# Patient Record
Sex: Female | Born: 1960
Health system: Southern US, Community
[De-identification: ages and names within clinical notes are randomized; demographics above are authoritative.]

## PROBLEM LIST (undated history)

## (undated) ENCOUNTER — Emergency Department: Disposition: A | Discharge status: Home / Self Care | Payer: BC Managed Care – PPO

## (undated) DIAGNOSIS — F319 Bipolar disorder, unspecified: Secondary | ICD-10-CM

## (undated) HISTORY — PX: ABDOMINAL HYSTERECTOMY: SHX81

---

## 1997-10-21 ENCOUNTER — Other Ambulatory Visit: Admission: RE | Admit: 1997-10-21 | Discharge: 1997-10-21 | Payer: Self-pay | Admitting: Family Medicine

## 2004-08-18 ENCOUNTER — Ambulatory Visit (HOSPITAL_COMMUNITY): Admission: RE | Admit: 2004-08-18 | Discharge: 2004-08-18 | Payer: Self-pay

## 2006-01-24 ENCOUNTER — Emergency Department (HOSPITAL_COMMUNITY): Admission: EM | Admit: 2006-01-24 | Discharge: 2006-01-25 | Payer: Self-pay | Admitting: Emergency Medicine

## 2006-01-25 ENCOUNTER — Ambulatory Visit: Payer: Self-pay | Admitting: Psychiatry

## 2006-01-25 ENCOUNTER — Inpatient Hospital Stay (HOSPITAL_COMMUNITY): Admission: AD | Admit: 2006-01-25 | Discharge: 2006-01-28 | Payer: Self-pay | Admitting: Psychiatry

## 2008-05-14 ENCOUNTER — Emergency Department (HOSPITAL_COMMUNITY): Admission: EM | Admit: 2008-05-14 | Discharge: 2008-05-14 | Payer: Self-pay | Admitting: Emergency Medicine

## 2010-03-30 ENCOUNTER — Encounter (HOSPITAL_COMMUNITY): Payer: Self-pay | Admitting: Obstetrics and Gynecology

## 2010-03-30 ENCOUNTER — Inpatient Hospital Stay (HOSPITAL_COMMUNITY): Admission: RE | Admit: 2010-03-30 | Discharge: 2010-04-01 | Payer: Self-pay | Admitting: Obstetrics and Gynecology

## 2010-09-10 LAB — CBC
MCHC: 34.6 g/dL (ref 30.0–36.0)
MCHC: 34.8 g/dL (ref 30.0–36.0)
Platelets: 294 10*3/uL (ref 150–400)
RDW: 13.6 % (ref 11.5–15.5)
RDW: 13.6 % (ref 11.5–15.5)
WBC: 10.6 10*3/uL — ABNORMAL HIGH (ref 4.0–10.5)
WBC: 8.3 10*3/uL (ref 4.0–10.5)

## 2010-09-10 LAB — TYPE AND SCREEN: ABO/RH(D): A POS

## 2010-09-10 LAB — SURGICAL PCR SCREEN
MRSA, PCR: NEGATIVE
Staphylococcus aureus: NEGATIVE

## 2010-09-10 LAB — PREGNANCY, URINE: Preg Test, Ur: NEGATIVE

## 2010-09-10 LAB — ABO/RH: ABO/RH(D): A POS

## 2010-11-13 NOTE — Discharge Summary (Signed)
Kelly Powers, Kelly Powers             ACCOUNT NO.:  [id number]   MEDICAL RECORD NO.:  [id number]          PATIENT TYPE:  IPS   LOCATION:  0501                          FACILITY:  [facility]   PHYSICIAN:  Anselm Jungling, MD  DATE OF BIRTH:  19-Jul-1960   DATE OF ADMISSION:  01/25/2006  DATE OF DISCHARGE:  01/28/2006                                 DISCHARGE SUMMARY   IDENTIFYING DATA AND REASON FOR ADMISSION:  This was the first [facility] admission  for Kelly Powers, a 50 year old married white female with a history of bipolar  disorder.  She was admitted via [facility], where she had been  treated for an overdose suicide attempt.  At the time of admission, she had  no current psychiatrist, but was about to start seeing Dr. Evelene Powers as an  outpatient.  She reported that with regard to the overdose, she had had a  bad day, and then called two friends in [place] state to say good-bye,  shortly after she had swallowed the overdose.  She had had inpatient  treatment four years ago somewhere in [place].  She denied suicidal ideation  at the time of admission and regretted her suicide attempt.  Please refer to  the admission note for further details pertaining to the symptoms,  circumstances, and history that led to her hospitalization.  She was given  an initial Axis I diagnosis of bipolar affective disorder, depressed,  without psychotic features.   MEDICAL AND LABORATORY:  The patient was medically treated and cleared at  [facility], and then assessed by the psychiatric nurse practitioner  upon admission to the inpatient service.  There were no significant medical  issues.   HOSPITAL COURSE:  The patient was admitted to the adult inpatient  psychiatric service.  She presented as a well-nourished, well-developed  female who was pleasant, bright, and well-organized.  There was nothing to  suggest any underlying psychosis or thought disorder.  She regretted her  overdose and suicide attempt,  and denied any further suicidal ideation.  She  was fully in favor of outpatient treatment, including individual counseling  and getting restarted on medications.   She was restarted on Lamictal, which she had taken previously.  Ambien was  utilized for sleep, and Xanax in low doses of 0.25 mg up to three times  daily as needed, for anxiety, were helpful as well.   The patient's husband came in for a family session.  In that meeting, the  patient reiterated that she was not suicidal.  She stated that she felt  ready to be discharged.  They discussed stresses at work, and the patient's  tendency to be a people pleaser and to take on a lot on the behalf of other  people.  Husband was very supportive.  They discussed the fact that patient  had just been fired from her job.   On the following day, the patient felt ready for discharge.  She continued  absent suicidal ideation.  She was tolerating medications well and was in  bright spirits.   AFTERCARE:  The patient was to follow  up with Dr. Evelene Powers with an appointment  on February 01, 2006.  In addition, the patient was to be set up with an  appointment with a therapist, Kelly Powers, to be arranged at the time of  this dictation.   DISCHARGE MEDICATIONS:  1.  Lamictal 100 mg b.i.d.  2.  Ambien 10 mg q.h.s. p.r.n. insomnia.  3.  Xanax 0.25 mg up to t.i.d. as needed for anxiety.   DISCHARGE DIAGNOSES:  AXIS I:  Bipolar disorder, currently depressed without  psychotic features.  AXIS II:  Deferred.  AXIS III:  No acute or chronic illnesses.  AXIS IV:  Stressors severe.  AXIS V:  Global Assessment of Functioning on discharge 70.           ______________________________  Anselm Jungling, MD  Electronically Signed     SPB/MEDQ  D:  01/31/2006  T:  01/31/2006  Job:  [phone]

## 2011-03-30 LAB — POCT RAPID STREP A: Streptococcus, Group A Screen (Direct): NEGATIVE

## 2011-07-26 ENCOUNTER — Other Ambulatory Visit: Payer: Self-pay | Admitting: Gastroenterology

## 2011-10-30 ENCOUNTER — Emergency Department (HOSPITAL_COMMUNITY)
Admission: EM | Admit: 2011-10-30 | Discharge: 2011-10-30 | Disposition: A | Discharge status: Home / Self Care | Payer: BC Managed Care – PPO | Attending: Emergency Medicine | Admitting: Emergency Medicine

## 2011-10-30 ENCOUNTER — Encounter (HOSPITAL_COMMUNITY): Payer: Self-pay | Admitting: *Deleted

## 2011-10-30 DIAGNOSIS — M549 Dorsalgia, unspecified: Secondary | ICD-10-CM | POA: Insufficient documentation

## 2011-10-30 DIAGNOSIS — M62838 Other muscle spasm: Secondary | ICD-10-CM | POA: Insufficient documentation

## 2011-10-30 DIAGNOSIS — J069 Acute upper respiratory infection, unspecified: Secondary | ICD-10-CM | POA: Insufficient documentation

## 2011-10-30 HISTORY — DX: Bipolar disorder, unspecified: F31.9

## 2011-10-30 MED ORDER — IBUPROFEN 800 MG PO TABS: 800.0000 mg | ORAL_TABLET | Freq: Three times a day (TID) | ORAL | Status: AC | PRN

## 2011-10-30 MED ORDER — OXYCODONE-ACETAMINOPHEN 5-325 MG PO TABS
1.0000 | ORAL_TABLET | Freq: Once | ORAL | Status: AC
  Administered 2011-10-30: 1 via ORAL
  Filled 2011-10-30: qty 1

## 2011-10-30 MED ORDER — METHOCARBAMOL 500 MG PO TABS: 1000.0000 mg | ORAL_TABLET | Freq: Three times a day (TID) | ORAL | Status: AC

## 2011-10-30 MED ORDER — HYDROCOD POLST-CHLORPHEN POLST 10-8 MG/5ML PO LQCR: 5.0000 mL | Freq: Two times a day (BID) | ORAL | Status: DC | PRN

## 2011-10-30 NOTE — ED Provider Notes (Signed)
Medical screening examination/treatment/procedure(s) were performed by non-physician practitioner and as supervising physician I was immediately available for consultation/collaboration.  Beryl Balz M Dajha Urquilla, MD 10/30/11 1826 

## 2011-10-30 NOTE — ED Provider Notes (Signed)
History     CSN: [id number]  Arrival date & time 10/30/11  0402   First MD Initiated Contact with Patient 10/30/11 0559      Chief Complaint  Patient presents with  . Back Pain    (Consider location/radiation/quality/duration/timing/severity/associated sxs/prior treatment) HPI Comments: Patient is a back pain and one week history of right nose, nasal congestion and cough. Patient states she she's coughing forcefully 2 days ago and had an acute onset of bilateral lower back pain. She's tried ibuprofen and heat for this without relief. States the pain is worse with movement in certain positions. It is interfering with her sleep. She denies red flag signs and symptoms of lower back pain. She denies shortness of breath or chest pain.  Patient is a 51 y.o. female presenting with back pain. The history is provided by the patient.  Back Pain  This is a new problem. The current episode started 2 days ago. The problem occurs constantly. The problem has not changed since onset.Associated with: coughing. The pain is present in the lumbar spine. The quality of the pain is described as aching. Radiates to: right and left buttock. The pain is moderate. The symptoms are aggravated by bending, certain positions and twisting. The pain is worse during the night. Pertinent negatives include no fever, no numbness, no weight loss, no bowel incontinence, no perianal numbness, no bladder incontinence, no dysuria, no pelvic pain, no paresthesias and no weakness. She has tried heat and NSAIDs for the symptoms. The treatment provided mild relief.    Past Medical History  Diagnosis Date  . Bipolar 1 disorder     Past Surgical History  Procedure Date  . Abdominal hysterectomy     History reviewed. No pertinent family history.  History  Substance Use Topics  . Smoking status: Former Smoker    Quit date: 10/30/1998  . Smokeless tobacco: Not on file  . Alcohol Use: Yes     occassionally/ x 1/week.    OB  History    Grav Para Term Preterm Abortions TAB SAB Ect Mult Living                  Review of Systems  Constitutional: Negative for fever, weight loss and unexpected weight change.  HENT: Positive for congestion and rhinorrhea. Negative for ear pain and sore throat.   Respiratory: Positive for cough.   Gastrointestinal: Negative for constipation and bowel incontinence.       Negative for fecal incontinence.   Genitourinary: Negative for bladder incontinence, dysuria, hematuria, flank pain, vaginal bleeding, vaginal discharge and pelvic pain.       Negative for urinary incontinence or retention.  Musculoskeletal: Positive for back pain.  Neurological: Negative for weakness, numbness and paresthesias.       Denies saddle paresthesias.    Allergies  Penicillins  Home Medications   Current Outpatient Rx  Name Route Sig Dispense Refill  . CARBAMAZEPINE ER (ANTIPSYCH) 200 MG PO CP12 Oral Take 600 mg by mouth at bedtime.     . ERGOCALCIFEROL 50000 UNITS PO CAPS Oral Take 50,000 Units by mouth once a week. saturday    . ESTRADIOL 1 MG PO TABS Oral Take 1 mg by mouth daily.    Marland Kitchen LAMOTRIGINE 200 MG PO TABS Oral Take 400 mg by mouth daily.      BP 116/70  Pulse 69  Temp(Src) 97.8 F (36.6 C) (Oral)  Resp 16  SpO2 95%  Physical Exam  Nursing note and vitals reviewed.  Constitutional: She appears well-developed and well-nourished.  HENT:  Head: Normocephalic and atraumatic.  Eyes: Conjunctivae are normal.  Neck: Normal range of motion. Neck supple.  Pulmonary/Chest: Effort normal and breath sounds normal. She has no wheezes.  Abdominal: Soft. There is no tenderness. There is no CVA tenderness.  Musculoskeletal: Normal range of motion.       There is no tenderness to palpation over cervical/thoracic/lumbar/sacral spine. Tenderness to palpation over lumbar paraspinal muscles. No step-off noted with palpation of spine.   Neurological: She is alert. She has normal strength and normal  reflexes. No sensory deficit. Gait normal.       5/5 strength in entire lower extremities bilaterally. No sensation deficit.   Skin: Skin is warm and dry. No rash noted.  Psychiatric: She has a normal mood and affect.    ED Course  Procedures (including critical care time)  Labs Reviewed - No data to display No results found.   1. Muscle spasm   2. Back pain   3. URI (upper respiratory infection)     6:15 AM Patient seen and examined. Medications ordered previously.   Vital signs reviewed and are as follows: Filed Vitals:   10/30/11 0408  BP: 116/70  Pulse: 69  Temp: 97.8 F (36.6 C)  Resp: 16   No red flag s/s of low back pain. Patient was counseled on back pain precautions and told to do activity as tolerated but do not lift, push, or pull heavy objects more than 10 pounds for the next week.  Patient counseled to use ice or heat on back for no longer than 15 minutes every hour.   Patient prescribed muscle relaxer and counseled on proper use of muscle relaxant medication.    Patient prescribed narcotic pain medicine/cough syrup and counseled on proper use of narcotic pain medications. Counseled not to combine this medication with others containing tylenol.   Counseled to take pain and relaxant medications only as prescribed and not to take more frequently, especially since she is on other antipsychotic meds.   Urged patient not to drink alcohol, drive, or perform any other activities that requires focus while taking either of these medications.  Patient urged to follow-up with PCP if pain does not improve with treatment and rest or if pain becomes recurrent. Urged to return with worsening severe pain, loss of bowel or bladder control, trouble walking.   The patient verbalizes understanding and agrees with the plan.  MDM  Patient with low back pain after coughing. No neurological deficits. Patient is ambulatory. No warning symptoms of back pain including: loss of bowel or  bladder control, night sweats, waking from sleep with back pain, unexplained fevers or weight loss, h/o cancer, IVDU, recent trauma. No concern for cauda equina, epidural abscess, or other serious cause of back pain. Conservative measures such as rest, ice/heat and pain medicine indicated with PCP follow-up if no improvement with conservative management. Cough syrup to treat pain and cough. Patient's current medication list reviewed.          [place], [place] 10/30/11 [phone]

## 2011-10-30 NOTE — ED Notes (Signed)
Pt c/o sudden onset low back pain starting Thursday morning after coughing. Pt denies relief from OTC ibuprofen. Pt c/o runny nose, cough, and shortness of breath x 1 week.

## 2011-10-30 NOTE — Discharge Instructions (Signed)
Please read and follow all provided instructions.  Your diagnoses today include:  1. Muscle spasm   2. Back pain     Tests performed today include:  Vital signs - see below for your results today  Medications prescribed:   Cough syrup containing hydrocodone - narcotic pain medication  You have been prescribed narcotic pain medication such as Vicodin or Percocet: DO NOT drive or perform any activities that require you to be awake and alert because this medicine can make you drowsy. BE VERY CAREFUL not to take multiple medicines containing Tylenol (also called acetaminophen). Doing so can lead to an overdose which can damage your liver and cause liver failure and possibly death.    Robaxin (methocarbamol) - muscle relaxer medication  You have been prescribed a muscle relaxer medication such as Robaxin, Flexeril, or Valium: DO NOT drive or perform any activities that require you to be awake and alert because this medicine can make you drowsy.    Ibuprofen - anti-inflammatory pain medication  Do not exceed 800mg  ibuprofen every 8 hours  You have been prescribed an anti-inflammatory medication or NSAID. Take with food. Take smallest effective dose for the shortest duration needed for your pain. Stop taking if you experience stomach pain or vomiting.   Take any prescribed medications only as directed.  Home care instructions:   Follow any educational materials contained in this packet  Please rest, use ice or heat on your back for the next several days  Do not lift, push, pull anything more than 10 pounds for the next week  Follow-up instructions: Please follow-up with your primary care provider in the next 1 week for further evaluation of your symptoms. If you do not have a primary care doctor -- see below for referral information.   Return instructions:  SEEK IMMEDIATE MEDICAL ATTENTION IF YOU HAVE:  New numbness, tingling, weakness, or problem with the use of your arms or  legs  Severe back pain not relieved with medications  Loss control of your bowels or bladder  Increasing pain in any areas of the body (such as chest or abdominal pain)  Shortness of breath, dizziness, or fainting.   Worsening nausea (feeling sick to your stomach), vomiting, fever, or sweats  Any other emergent concerns regarding your health   Additional Information:  Your vital signs today were: BP 116/70  Pulse 69  Temp(Src) 97.8 F (36.6 C) (Oral)  Resp 16  SpO2 95% If your blood pressure (BP) was elevated above 135/85 this visit, please have this repeated by your doctor within one month. -------------- No Primary Care Doctor Call [facility]  [phone] Other agencies that provide inexpensive medical care    [facility]  [phone]    [facility]  [phone]    [facility]  [phone]    [facility]  [phone]    [facility]  [phone]    [facility]  [phone] -------------- RESOURCE GUIDE:  Dental Problems  Patients with Medicaid: [facility] [phone] [address]                                            [phone] [address] Phone:  [phone]  Phone:  [phone]  If unable to pay or uninsured, contact:  [facility] or [facility]. to become qualified for the adult dental clinic.  Chronic Pain Problems Contact [facility]  [phone] Patients need to be referred by their primary care doctor.  Insufficient Money for Medicine Contact [employer]:  call "211" or [facility] [phone].  Psychological Services [facility]  [phone] [facility]  [phone] [facility]   [phone] (emergency services [phone])  Substance Abuse Resources [facility]  [phone] [facility] [phone] The  [place] 5147979771 Floydene Flock [phone] Residential & Outpatient Substance Abuse Program  [phone]  Abuse/Neglect [facility] Child Abuse Hotline [phone] [facility] Child Abuse Hotline [phone] (After Hours)  Emergency Shelter [facility] [phone]  Maternity Homes [facility] at the [place] [facility] [phone] [facility] [phone]  [facility] Resources  [facility]     [employer]                          [facility] Dept. [address] [place]                       [address]      [address]  Blondell Reveal Phone:  [phone]                                   Phone:  [phone]                 Phone:  [phone]  [facility] Mental Health Phone:  [phone]  [facility] Child Abuse Hotline [phone] [phone] (After Hours)

## 2011-10-30 NOTE — ED Notes (Signed)
MD/PA at bedside. 

## 2011-11-12 ENCOUNTER — Other Ambulatory Visit: Payer: Self-pay | Admitting: Orthopaedic Surgery

## 2011-11-12 DIAGNOSIS — M545 Low back pain, unspecified: Secondary | ICD-10-CM

## 2011-11-17 ENCOUNTER — Ambulatory Visit
Admission: RE | Admit: 2011-11-17 | Discharge: 2011-11-17 | Disposition: A | Discharge status: Home / Self Care | Payer: BC Managed Care – PPO | Source: Ambulatory Visit | Attending: Orthopaedic Surgery | Admitting: Orthopaedic Surgery

## 2011-11-17 DIAGNOSIS — M545 Low back pain, unspecified: Secondary | ICD-10-CM

## 2012-04-19 ENCOUNTER — Other Ambulatory Visit: Payer: Self-pay | Admitting: Orthopaedic Surgery

## 2012-04-19 DIAGNOSIS — M25512 Pain in left shoulder: Secondary | ICD-10-CM

## 2012-04-21 ENCOUNTER — Ambulatory Visit
Admission: RE | Admit: 2012-04-21 | Discharge: 2012-04-21 | Disposition: A | Discharge status: Home / Self Care | Payer: BC Managed Care – PPO | Source: Ambulatory Visit | Attending: Orthopaedic Surgery | Admitting: Orthopaedic Surgery

## 2012-04-21 DIAGNOSIS — M25512 Pain in left shoulder: Secondary | ICD-10-CM

## 2014-03-08 ENCOUNTER — Other Ambulatory Visit: Payer: Self-pay | Admitting: Obstetrics and Gynecology

## 2014-03-11 LAB — CYTOLOGY - PAP

## 2015-03-27 ENCOUNTER — Other Ambulatory Visit: Payer: Self-pay | Admitting: Obstetrics and Gynecology

## 2015-03-31 LAB — CYTOLOGY - PAP

## 2015-04-12 ENCOUNTER — Emergency Department (HOSPITAL_COMMUNITY): Payer: BLUE CROSS/BLUE SHIELD

## 2015-04-12 ENCOUNTER — Encounter (HOSPITAL_COMMUNITY): Payer: Self-pay | Admitting: Emergency Medicine

## 2015-04-12 ENCOUNTER — Emergency Department (HOSPITAL_COMMUNITY)
Admission: EM | Admit: 2015-04-12 | Discharge: 2015-04-13 | Disposition: A | Discharge status: Home / Self Care | Payer: BLUE CROSS/BLUE SHIELD | Attending: Emergency Medicine | Admitting: Emergency Medicine

## 2015-04-12 DIAGNOSIS — F319 Bipolar disorder, unspecified: Secondary | ICD-10-CM | POA: Diagnosis not present

## 2015-04-12 DIAGNOSIS — Z87891 Personal history of nicotine dependence: Secondary | ICD-10-CM | POA: Diagnosis not present

## 2015-04-12 DIAGNOSIS — Z880 Allergy status to penicillin: Secondary | ICD-10-CM | POA: Diagnosis not present

## 2015-04-12 DIAGNOSIS — R202 Paresthesia of skin: Secondary | ICD-10-CM | POA: Diagnosis not present

## 2015-04-12 DIAGNOSIS — R1310 Dysphagia, unspecified: Secondary | ICD-10-CM

## 2015-04-12 DIAGNOSIS — Z79899 Other long term (current) drug therapy: Secondary | ICD-10-CM | POA: Insufficient documentation

## 2015-04-12 NOTE — ED Notes (Signed)
Patient here with complaint of "forced swallowing". States that she began to feel this way a couple hours ago. States that she can drink fluids without regurgitation. Explains that she is able to swallow but it feels difficult. Denies nausea, vomiting, diarrhea, cough, congestion, and only states "my co-worker said my nose was red yesterday"; nose currently appears normal. NAD, ambulatory. Deep breath does not induce pain, lung sounds clear.

## 2015-04-12 NOTE — ED Provider Notes (Signed)
CSN: [id number]     Arrival date & time 04/12/15  2259 History  By signing my name below, I, Lyndel Safe, attest that this documentation has been prepared under the direction and in the presence of Arthor Captain, PA-C. Electronically Signed: Lyndel Safe, ED Scribe. 04/12/2015. 12:00 AM.   Chief Complaint  Patient presents with  . Dysphagia   The history is provided by the patient. No language interpreter was used.   HPI Comments: Kelly Powers is a 54 y.o. female who presents to the Emergency Department complaining of constant, dysphagia that she describes as forced swallowing onset spontaneously 3 hours ago. Pt is able to drink and swallow fluids but she states she has to force herself to swallow. She associates a paraesthesia sensation to the tip of her tongue and mild irritation in the back of her throat but denies this to be a sore throat. Also denies past similar experiences, dysphonia or speech difficulty, or unilateral weakness.   Past Medical History  Diagnosis Date  . Bipolar 1 disorder [facility])    Past Surgical History  Procedure Laterality Date  . Abdominal hysterectomy     History reviewed. No pertinent family history. Social History  Substance Use Topics  . Smoking status: Former Smoker    Quit date: 10/30/1998  . Smokeless tobacco: None  . Alcohol Use: Yes     Comment: occassionally/ x 1/week.   OB History    No data available     Review of Systems  HENT: Positive for trouble swallowing. Negative for sore throat.   Neurological: Positive for numbness. Negative for speech difficulty and weakness.   Allergies  Penicillins  Home Medications   Prior to Admission medications   Medication Sig Start Date End Date Taking? Authorizing Provider  carbamazepine (ANTIPSYCHOTIC - EQUETRO) 200 MG CP12 Take 600 mg by mouth at bedtime.     Historical Provider, MD  chlorpheniramine-HYDROcodone (TUSSIONEX PENNKINETIC ER) 10-8 MG/5ML LQCR Take 5 mLs by mouth every 12  (twelve) hours as needed. 10/30/11   Renne Crigler, PA-C  ergocalciferol (VITAMIN D2) 50000 UNITS capsule Take 50,000 Units by mouth once a week. saturday    Historical Provider, MD  estradiol (ESTRACE) 1 MG tablet Take 1 mg by mouth daily.    Historical Provider, MD  lamoTRIgine (LAMICTAL) 200 MG tablet Take 400 mg by mouth daily.    Historical Provider, MD   BP 144/75 mmHg  Pulse 78  Temp(Src) 98.4 F (36.9 C) (Oral)  Resp 18  Ht  (1.651 m)  Wt 179 lb (81.194 kg)  BMI 29.79 kg/m2  SpO2 99% Physical Exam  Constitutional: She is oriented to person, place, and time. She appears well-developed and well-nourished. No distress.  HENT:  Head: Normocephalic and atraumatic.  Mouth/Throat: Oropharynx is clear and moist.  Eyes: Conjunctivae and EOM are normal. Pupils are equal, round, and reactive to light. No scleral icterus.  No horizontal, vertical or rotational nystagmus  Neck: Normal range of motion. Neck supple.  Full active and passive ROM without pain No midline or paraspinal tenderness No nuchal rigidity or meningeal signs  Cardiovascular: Normal rate, regular rhythm and intact distal pulses.   Pulmonary/Chest: Effort normal and breath sounds normal. No respiratory distress. She has no wheezes. She has no rales.  Abdominal: Soft. Bowel sounds are normal. There is no tenderness. There is no rebound and no guarding.  Musculoskeletal: Normal range of motion.  Lymphadenopathy:    She has no cervical adenopathy.  Neurological: She is  alert and oriented to person, place, and time. She has normal reflexes. No cranial nerve deficit. She exhibits normal muscle tone. Coordination normal.  Mental Status:  Alert, oriented, thought content appropriate. Speech fluent without evidence of aphasia. Able to follow 2 step commands without difficulty.  Cranial Nerves:  II:  Peripheral visual fields grossly normal, pupils equal, round, reactive to light III,IV, VI: ptosis not present, extra-ocular  motions intact bilaterally  V,VII: smile symmetric, facial light touch sensation equal VIII: hearing grossly normal bilaterally  IX,X: midline uvula rise  XI: bilateral shoulder shrug equal and strong XII: midline tongue extension  Motor:  5/5 in upper and lower extremities bilaterally including strong and equal grip strength and dorsiflexion/plantar flexion Sensory: Pinprick and light touch normal in all extremities.  Deep Tendon Reflexes: 2+ and symmetric  Cerebellar: normal finger-to-nose with bilateral upper extremities Gait: normal gait and balance CV: distal pulses palpable throughout   Skin: Skin is warm and dry. No rash noted. She is not diaphoretic.  Psychiatric: She has a normal mood and affect. Her behavior is normal. Judgment and thought content normal.  Nursing note and vitals reviewed.   ED Course  Procedures  DIAGNOSTIC STUDIES: Oxygen Saturation is 99% on RA, normal by my interpretation.    COORDINATION OF CARE: 11:57 PM Discussed treatment plan with pt at bedside and pt agreed to plan.  Imaging Review No results found. I have personally reviewed and evaluated these images results as part of my medical decision-making.   MDM   Final diagnoses:  None    12:42 AM. Code stroke ordered CT head negative. Seen in shared visit wth Dr. Clayborne DanaMesner Distant smoking hx. No other risk factors    I, Arthor CaptainHarris, Syniah Berne, personally performed the services described in this documentation. All medical record entries made by the scribe were at my direction and in my presence.  I have reviewed the chart and discharge instructions and agree that the record reflects my personal performance and is accurate and complete. Arthor CaptainHarris, Romi Rathel.  04/13/2015. 12:45 AM.       Arthor CaptainAbigail Liam Bossman, PA-C 04/13/15 0045  Marily MemosJason Mesner, MD 04/13/15 [phone]

## 2015-04-13 ENCOUNTER — Emergency Department (HOSPITAL_COMMUNITY): Payer: BLUE CROSS/BLUE SHIELD

## 2015-04-13 DIAGNOSIS — R1310 Dysphagia, unspecified: Secondary | ICD-10-CM | POA: Diagnosis not present

## 2015-04-13 DIAGNOSIS — R202 Paresthesia of skin: Secondary | ICD-10-CM

## 2015-04-13 LAB — CBC
HEMATOCRIT: 35.7 % — AB (ref 36.0–46.0)
Hemoglobin: 12.4 g/dL (ref 12.0–15.0)
MCH: 30.8 pg (ref 26.0–34.0)
MCHC: 34.7 g/dL (ref 30.0–36.0)
MCV: 88.6 fL (ref 78.0–100.0)
PLATELETS: 210 10*3/uL (ref 150–400)
RBC: 4.03 MIL/uL (ref 3.87–5.11)
RDW: 12.6 % (ref 11.5–15.5)
WBC: 7.1 10*3/uL (ref 4.0–10.5)

## 2015-04-13 LAB — RAPID URINE DRUG SCREEN, HOSP PERFORMED
AMPHETAMINES: NOT DETECTED
BARBITURATES: NOT DETECTED
BENZODIAZEPINES: NOT DETECTED
Cocaine: NOT DETECTED
Opiates: NOT DETECTED
Tetrahydrocannabinol: NOT DETECTED

## 2015-04-13 LAB — COMPREHENSIVE METABOLIC PANEL
ALBUMIN: 3.9 g/dL (ref 3.5–5.0)
ALT: 24 U/L (ref 14–54)
AST: 21 U/L (ref 15–41)
Alkaline Phosphatase: 41 U/L (ref 38–126)
Anion gap: 6 (ref 5–15)
BUN: 15 mg/dL (ref 6–20)
CHLORIDE: 102 mmol/L (ref 101–111)
CO2: 33 mmol/L — AB (ref 22–32)
CREATININE: 0.79 mg/dL (ref 0.44–1.00)
Calcium: 9 mg/dL (ref 8.9–10.3)
GFR calc Af Amer: >60 mL/min (ref >60)
GFR calc non Af Amer: >60 mL/min (ref >60)
Glucose, Bld: 94 mg/dL (ref 65–99)
Potassium: 4.2 mmol/L (ref 3.5–5.1)
Sodium: 141 mmol/L (ref 135–145)
Total Bilirubin: 0.4 mg/dL (ref 0.3–1.2)
Total Protein: 6.5 g/dL (ref 6.5–8.1)

## 2015-04-13 LAB — I-STAT CHEM 8, ED
BUN: 18 mg/dL (ref 6–20)
CHLORIDE: 100 mmol/L — AB (ref 101–111)
Calcium, Ion: 1.1 mmol/L — ABNORMAL LOW (ref 1.12–1.23)
Creatinine, Ser: 0.9 mg/dL (ref 0.44–1.00)
Glucose, Bld: 88 mg/dL (ref 65–99)
HCT: 38 % (ref 36.0–46.0)
Hemoglobin: 12.9 g/dL (ref 12.0–15.0)
POTASSIUM: 4.2 mmol/L (ref 3.5–5.1)
Sodium: 141 mmol/L (ref 135–145)
TCO2: 27 mmol/L (ref 0–100)

## 2015-04-13 LAB — DIFFERENTIAL
BASOS ABS: 0 10*3/uL (ref 0.0–0.1)
BASOS PCT: 0 %
Eosinophils Absolute: 0.2 10*3/uL (ref 0.0–0.7)
Eosinophils Relative: 3 %
Lymphocytes Relative: 38 %
Lymphs Abs: 2.7 10*3/uL (ref 0.7–4.0)
Monocytes Absolute: 0.5 10*3/uL (ref 0.1–1.0)
Monocytes Relative: 7 %
NEUTROS ABS: 3.7 10*3/uL (ref 1.7–7.7)
Neutrophils Relative %: 52 %

## 2015-04-13 LAB — PROTIME-INR
INR: 1.06 (ref 0.00–1.49)
Prothrombin Time: 14 seconds (ref 11.6–15.2)

## 2015-04-13 LAB — URINALYSIS, ROUTINE W REFLEX MICROSCOPIC
BILIRUBIN URINE: NEGATIVE
GLUCOSE, UA: NEGATIVE mg/dL
Hgb urine dipstick: NEGATIVE
KETONES UR: NEGATIVE mg/dL
Leukocytes, UA: NEGATIVE
NITRITE: NEGATIVE
PH: 7 (ref 5.0–8.0)
Protein, ur: NEGATIVE mg/dL
Specific Gravity, Urine: 1.009 (ref 1.005–1.030)
Urobilinogen, UA: 0.2 mg/dL (ref 0.0–1.0)

## 2015-04-13 LAB — I-STAT TROPONIN, ED: Troponin i, poc: 0 ng/mL (ref 0.00–0.08)

## 2015-04-13 LAB — APTT: APTT: 31 s (ref 24–37)

## 2015-04-13 LAB — ETHANOL

## 2015-04-13 LAB — CARBAMAZEPINE LEVEL, TOTAL: Carbamazepine Lvl: 5.4 ug/mL (ref 4.0–12.0)

## 2015-04-13 MED ORDER — LORAZEPAM 2 MG/ML IJ SOLN
1.0000 mg | Freq: Once | INTRAMUSCULAR | Status: AC
  Administered 2015-04-13: 1 mg via INTRAVENOUS
  Filled 2015-04-13: qty 1

## 2015-04-13 MED ORDER — LORAZEPAM 2 MG/ML IJ SOLN: 1.0000 mg | Freq: Once | INTRAMUSCULAR | Status: DC

## 2015-04-13 NOTE — Consult Note (Signed)
Referring Physician: Mesner    Chief Complaint: Difficulty swallowing, numbness on the tip of her tongue  HPI: Kelly Powers is an 54 y.o. female who reports that she was home with her husband and had the acute onset of difficulty swallowing.  Then noted numbness on the tip of her tongue.  She has never experienced this before.  Symptoms have improved but not completely resolved.  She is able to handle her own secretions but reports that she must work to swallow.  Initial NIHSS of 0.  Date last known well: Date: 04/12/2015 Time last known well: Time: 21:30 tPA Given: No: Minimal symptoms  MRankin: 0  Past Medical History  Diagnosis Date  . Bipolar 1 disorder [facility])     Past Surgical History  Procedure Laterality Date  . Abdominal hysterectomy      Family history: Father alive with diabetes.  Mother deceased from COPD.  Also had steroid induced diabetes.    Social History:  reports that she quit smoking about 16 years ago. She does not have any smokeless tobacco history on file. She reports that she drinks alcohol. She reports that she does not use illicit drugs.  Allergies:  Allergies  Allergen Reactions  . Penicillins Rash    Medications: I have reviewed the patient's current medications. Prior to Admission:  Prior to Admission medications   Medication Sig Start Date End Date Taking? Authorizing Provider  carbamazepine (ANTIPSYCHOTIC - EQUETRO) 200 MG CP12 Take 600 mg by mouth at bedtime.     Historical Provider, MD  chlorpheniramine-HYDROcodone (TUSSIONEX PENNKINETIC ER) 10-8 MG/5ML LQCR Take 5 mLs by mouth every 12 (twelve) hours as needed. 10/30/11   Renne Crigler, PA-C  ergocalciferol (VITAMIN D2) 50000 UNITS capsule Take 50,000 Units by mouth once a week. saturday    Historical Provider, MD  estradiol (ESTRACE) 1 MG tablet Take 1 mg by mouth daily.    Historical Provider, MD  lamoTRIgine (LAMICTAL) 200 MG tablet Take 400 mg by mouth daily.    Historical Provider, MD    ROS: History obtained from the patient  General ROS: negative for - chills, fatigue, fever, night sweats, weight gain or weight loss Psychological ROS: negative for - behavioral disorder, hallucinations, memory difficulties, mood swings or suicidal ideation Ophthalmic ROS: negative for - blurry vision, double vision, eye pain or loss of vision ENT ROS: negative for - epistaxis, nasal discharge, oral lesions, sore throat, tinnitus or vertigo Allergy and Immunology ROS: negative for - hives or itchy/watery eyes Hematological and Lymphatic ROS: negative for - bleeding problems, bruising or swollen lymph nodes Endocrine ROS: negative for - galactorrhea, hair pattern changes, polydipsia/polyuria or temperature intolerance Respiratory ROS: negative for - cough, hemoptysis, shortness of breath or wheezing Cardiovascular ROS: negative for - chest pain, dyspnea on exertion, edema or irregular heartbeat Gastrointestinal ROS: negative for - abdominal pain, diarrhea, hematemesis, nausea/vomiting or stool incontinence Genito-Urinary ROS: negative for - dysuria, hematuria, incontinence or urinary frequency/urgency Musculoskeletal ROS: negative for - joint swelling or muscular weakness Neurological ROS: as noted in HPI Dermatological ROS: negative for rash and skin lesion changes  Physical Examination: Blood pressure 144/75, pulse 78, temperature 98.4 F (36.9 C), temperature source Oral, resp. rate 18, height  (1.651 m), weight 81.194 kg (179 lb), SpO2 99 %.  Gen-NAD HEENT-  Normocephalic, no lesions, without obvious abnormality.  Normal external eye and conjunctiva.  Normal TM's bilaterally.  Normal auditory canals and external ears. Normal external nose, mucus membranes and septum.  Normal pharynx.  Cardiovascular- S1, S2 normal, pulses palpable throughout   Lungs- chest clear, no wheezing, rales, normal symmetric air entry Abdomen- soft, non-tender; bowel sounds normal; no masses,  no  organomegaly Extremities- no edema Lymph-no adenopathy palpable Musculoskeletal-no joint tenderness, deformity or swelling Skin-warm and dry, no hyperpigmentation, vitiligo, or suspicious lesions  Neurological Examination Mental Status: Alert, oriented, thought content appropriate.  Speech fluent without evidence of aphasia.  Able to follow 3 step commands without difficulty. Cranial Nerves: II: Discs flat bilaterally; Visual fields grossly normal, pupils equal, round, reactive to light and accommodation III,IV, VI: ptosis not present, extra-ocular motions intact bilaterally V,VII: smile symmetric, facial light touch sensation normal bilaterally VIII: hearing normal bilaterally IX,X: gag reflex present XI: bilateral shoulder shrug XII: midline tongue extension Motor: Right : Upper extremity   5/5    Left:     Upper extremity   5/5  Lower extremity   5/5     Lower extremity   5/5 Tone and bulk:normal tone throughout; no atrophy noted Sensory: Pinprick and light touch intact throughout, bilaterally Deep Tendon Reflexes: 2+ and symmetric throughout Plantars: Right: downgoing   Left: downgoing Cerebellar: normal finger-to-nose and normal heel-to-shin testing bilaterally   Laboratory Studies:  Basic Metabolic Panel:  Recent Labs Lab 04/13/15 0023  NA 141  K 4.2  CL 100*  GLUCOSE 88  BUN 18  CREATININE 0.90    Liver Function Tests: No results for input(s): AST, ALT, ALKPHOS, BILITOT, PROT, ALBUMIN in the last 168 hours. No results for input(s): LIPASE, AMYLASE in the last 168 hours. No results for input(s): AMMONIA in the last 168 hours.  CBC:  Recent Labs Lab 04/13/15 0013 04/13/15 0023  WBC 7.1  --   NEUTROABS 3.7  --   HGB 12.4 12.9  HCT 35.7* 38.0  MCV 88.6  --   PLT 210  --     Cardiac Enzymes: No results for input(s): CKTOTAL, CKMB, CKMBINDEX, TROPONINI in the last 168 hours.  BNP: Invalid input(s): POCBNP  CBG: No results for input(s): GLUCAP in  the last 168 hours.  Microbiology: Results for orders placed or performed during the hospital encounter of 03/30/10  Surgical pcr screen     Status: None   Collection Time: 03/23/10 12:00 PM  Result Value Ref Range Status   MRSA, PCR NEGATIVE NEGATIVE Final   Staphylococcus aureus  NEGATIVE Final    NEGATIVE        The Xpert SA Assay (FDA approved for NASAL specimens only), is one component of a comprehensive surveillance program.  It is not intended to diagnose infection nor to guide or monitor treatment.    Coagulation Studies:  Recent Labs  04/13/15 0013  LABPROT 14.0  INR 1.06    Urinalysis: No results for input(s): COLORURINE, LABSPEC, PHURINE, GLUCOSEU, HGBUR, BILIRUBINUR, KETONESUR, PROTEINUR, UROBILINOGEN, NITRITE, LEUKOCYTESUR in the last 168 hours.  Invalid input(s): APPERANCEUR  Lipid Panel: No results found for: CHOL, TRIG, HDL, CHOLHDL, VLDL, LDLCALC  HgbA1C: No results found for: HGBA1C  Urine Drug Screen:  No results found for: LABOPIA, COCAINSCRNUR, LABBENZ, AMPHETMU, THCU, LABBARB  Alcohol Level:  Recent Labs Lab 04/13/15 0013  ETH <5    Other results: EKG: sinus rhythm at 60 bpm.  Imaging: Dg Neck Soft Tissue  04/13/2015  CLINICAL DATA:  Difficulty swallowing EXAM: NECK SOFT TISSUES - 1+ VIEW COMPARISON:  None. FINDINGS: There is no evidence of retropharyngeal soft tissue swelling or epiglottic enlargement. The cervical airway is unremarkable and no radio-opaque foreign body  identified. There is degenerative disc disease at C4-5, C5-6 and C6-7. IMPRESSION: Unremarkable neck soft tissues. Electronically Signed   By: Elige KoHetal  Patel   On: 04/13/2015 00:13   Ct Head Wo Contrast  04/13/2015  CLINICAL DATA:  Numbness, tingling, dysphagia EXAM: CT HEAD WITHOUT CONTRAST TECHNIQUE: Contiguous axial images were obtained from the base of the skull through the vertex without intravenous contrast. COMPARISON:  None. FINDINGS: There is no evidence of mass  effect, midline shift or extra-axial fluid collections. There is no evidence of a space-occupying lesion or intracranial hemorrhage. There is no evidence of a cortical-based area of acute infarction. The ventricles and sulci are appropriate for the patient's age. The basal cisterns are patent. Visualized portions of the orbits are unremarkable. The visualized portions of the paranasal sinuses and mastoid air cells are unremarkable. The osseous structures are unremarkable. IMPRESSION: No acute intracranial pathology. These results were called by telephone at the time of interpretation on 04/13/2015 at 12:35 am to [facility] , who verbally acknowledged these results. Electronically Signed   By: Elige KoHetal  Patel   On: 04/13/2015 00:35    Assessment: 54 y.o. female presenting with new onset numbness on the tip of the tongue and difficulty swallowing.  Otherwise neurological examination is unremarkable.  NIHSS of 0.  Head CT personally reviewed and shows no acute changes.  Will rule out the possibility of dissection as well.    Stroke Risk Factors - none  Plan: 1. MRI, MRA  of the brain without contrast 2. Tegretol level 3. Will follow up after above results are available.     Thana FarrLeslie Ericca Labra, MD [facility] [phone] 04/13/2015, 12:53 AM  Addendum: MRI and MRA personally reviewed.  No abnormalities noted.  Tegretol level normal.  Recommendations: 1.  Patient to follow up with ENT 2.  No further neurologic intervention is recommended at this time.  Thank you for allowing neurology to participate in the care of this patient.  Case discussed with Dr. Myrla HalstedMesner  Annjanette Wertenberger, MD [facility] [phone] 04/13/2015  3:57 AM

## 2015-04-13 NOTE — ED Notes (Signed)
Pt to MRI at this time.

## 2015-04-13 NOTE — ED Notes (Signed)
Code stroke called

## 2015-04-13 NOTE — Progress Notes (Signed)
Code Stroke called on 54 y.o female. Patient complains of acute difficulty swallowing associated with numbness and tingling of tongue and mouth. Denies any similar symptoms prior to this incident. Past medical history includes bipolar disorder per Epic chart review.  CT completed STAT, negative per Neurologist Dr. Thad Rangereynolds. NIHSS completed yielding 0. CBG 88 on STAT blood draw. Pt for MRI tonight, further stroke work up pending results. Pt to remain within TPA window until 0200.

## 2015-04-13 NOTE — ED Notes (Signed)
Pt has already been to xray.  She developed some swallowing difficulty after eating an apple approx 2 hours ago.. She can swallow better if she drinks water  Then swallows

## 2015-04-13 NOTE — ED Notes (Signed)
thge pt requires more  Treatment than fast track.  Pa sending to regular ed bed

## 2015-04-13 NOTE — ED Notes (Signed)
Patient denies pain and is resting comfortably.  

## 2015-04-13 NOTE — ED Notes (Signed)
Dr. Thad Rangereynolds in to assess pt

## 2016-01-09 DIAGNOSIS — N3000 Acute cystitis without hematuria: Secondary | ICD-10-CM | POA: Diagnosis not present

## 2016-01-09 DIAGNOSIS — R300 Dysuria: Secondary | ICD-10-CM | POA: Diagnosis not present

## 2016-01-09 DIAGNOSIS — S1086XA Insect bite of other specified part of neck, initial encounter: Secondary | ICD-10-CM | POA: Diagnosis not present

## 2016-01-13 DIAGNOSIS — R300 Dysuria: Secondary | ICD-10-CM | POA: Diagnosis not present

## 2016-01-15 DIAGNOSIS — F3111 Bipolar disorder, current episode manic without psychotic features, mild: Secondary | ICD-10-CM | POA: Diagnosis not present

## 2016-01-15 DIAGNOSIS — F3176 Bipolar disorder, in full remission, most recent episode depressed: Secondary | ICD-10-CM | POA: Diagnosis not present

## 2016-02-25 DIAGNOSIS — K2900 Acute gastritis without bleeding: Secondary | ICD-10-CM | POA: Diagnosis not present

## 2016-04-07 DIAGNOSIS — Z01419 Encounter for gynecological examination (general) (routine) without abnormal findings: Secondary | ICD-10-CM | POA: Diagnosis not present

## 2016-04-07 DIAGNOSIS — Z6832 Body mass index (BMI) 32.0-32.9, adult: Secondary | ICD-10-CM | POA: Diagnosis not present

## 2016-04-07 DIAGNOSIS — Z1231 Encounter for screening mammogram for malignant neoplasm of breast: Secondary | ICD-10-CM | POA: Diagnosis not present

## 2016-04-07 DIAGNOSIS — Z13820 Encounter for screening for osteoporosis: Secondary | ICD-10-CM | POA: Diagnosis not present

## 2016-06-06 DIAGNOSIS — S46812A Strain of other muscles, fascia and tendons at shoulder and upper arm level, left arm, initial encounter: Secondary | ICD-10-CM | POA: Diagnosis not present

## 2016-07-20 DIAGNOSIS — F3111 Bipolar disorder, current episode manic without psychotic features, mild: Secondary | ICD-10-CM | POA: Diagnosis not present

## 2016-07-28 DIAGNOSIS — Z23 Encounter for immunization: Secondary | ICD-10-CM | POA: Diagnosis not present

## 2016-07-28 DIAGNOSIS — Z0000 Encounter for general adult medical examination without abnormal findings: Secondary | ICD-10-CM | POA: Diagnosis not present

## 2016-07-28 DIAGNOSIS — E785 Hyperlipidemia, unspecified: Secondary | ICD-10-CM | POA: Diagnosis not present

## 2016-09-01 DIAGNOSIS — F3131 Bipolar disorder, current episode depressed, mild: Secondary | ICD-10-CM | POA: Diagnosis not present

## 2016-09-01 DIAGNOSIS — F4322 Adjustment disorder with anxiety: Secondary | ICD-10-CM | POA: Diagnosis not present

## 2016-09-01 DIAGNOSIS — F3111 Bipolar disorder, current episode manic without psychotic features, mild: Secondary | ICD-10-CM | POA: Diagnosis not present

## 2016-10-13 DIAGNOSIS — F3174 Bipolar disorder, in full remission, most recent episode manic: Secondary | ICD-10-CM | POA: Diagnosis not present

## 2016-10-13 DIAGNOSIS — F3176 Bipolar disorder, in full remission, most recent episode depressed: Secondary | ICD-10-CM | POA: Diagnosis not present

## 2016-12-15 DIAGNOSIS — F3176 Bipolar disorder, in full remission, most recent episode depressed: Secondary | ICD-10-CM | POA: Diagnosis not present

## 2016-12-15 DIAGNOSIS — F3174 Bipolar disorder, in full remission, most recent episode manic: Secondary | ICD-10-CM | POA: Diagnosis not present

## 2016-12-15 DIAGNOSIS — F900 Attention-deficit hyperactivity disorder, predominantly inattentive type: Secondary | ICD-10-CM | POA: Diagnosis not present

## 2017-01-21 DIAGNOSIS — E559 Vitamin D deficiency, unspecified: Secondary | ICD-10-CM | POA: Diagnosis not present

## 2017-03-01 DIAGNOSIS — N390 Urinary tract infection, site not specified: Secondary | ICD-10-CM | POA: Diagnosis not present

## 2017-03-01 DIAGNOSIS — N3000 Acute cystitis without hematuria: Secondary | ICD-10-CM | POA: Diagnosis not present

## 2017-04-05 DIAGNOSIS — L237 Allergic contact dermatitis due to plants, except food: Secondary | ICD-10-CM | POA: Diagnosis not present

## 2017-04-13 DIAGNOSIS — Z6831 Body mass index (BMI) 31.0-31.9, adult: Secondary | ICD-10-CM | POA: Diagnosis not present

## 2017-04-13 DIAGNOSIS — N9089 Other specified noninflammatory disorders of vulva and perineum: Secondary | ICD-10-CM | POA: Diagnosis not present

## 2017-04-13 DIAGNOSIS — Z1231 Encounter for screening mammogram for malignant neoplasm of breast: Secondary | ICD-10-CM | POA: Diagnosis not present

## 2017-04-13 DIAGNOSIS — Z01419 Encounter for gynecological examination (general) (routine) without abnormal findings: Secondary | ICD-10-CM | POA: Diagnosis not present

## 2017-05-16 DIAGNOSIS — J0110 Acute frontal sinusitis, unspecified: Secondary | ICD-10-CM | POA: Diagnosis not present

## 2017-05-16 DIAGNOSIS — Z23 Encounter for immunization: Secondary | ICD-10-CM | POA: Diagnosis not present

## 2017-06-10 DIAGNOSIS — F3174 Bipolar disorder, in full remission, most recent episode manic: Secondary | ICD-10-CM | POA: Diagnosis not present

## 2017-06-10 DIAGNOSIS — F3176 Bipolar disorder, in full remission, most recent episode depressed: Secondary | ICD-10-CM | POA: Diagnosis not present

## 2017-07-03 DIAGNOSIS — B9689 Other specified bacterial agents as the cause of diseases classified elsewhere: Secondary | ICD-10-CM | POA: Diagnosis not present

## 2017-07-03 DIAGNOSIS — J019 Acute sinusitis, unspecified: Secondary | ICD-10-CM | POA: Diagnosis not present

## 2017-07-29 DIAGNOSIS — J342 Deviated nasal septum: Secondary | ICD-10-CM | POA: Diagnosis not present

## 2017-07-29 DIAGNOSIS — R51 Headache: Secondary | ICD-10-CM | POA: Diagnosis not present

## 2017-08-22 DIAGNOSIS — J342 Deviated nasal septum: Secondary | ICD-10-CM | POA: Diagnosis not present

## 2017-08-22 DIAGNOSIS — F40240 Claustrophobia: Secondary | ICD-10-CM | POA: Diagnosis not present

## 2017-08-22 DIAGNOSIS — R51 Headache: Secondary | ICD-10-CM | POA: Diagnosis not present

## 2017-08-24 ENCOUNTER — Other Ambulatory Visit: Payer: Self-pay | Admitting: Otolaryngology

## 2017-08-24 DIAGNOSIS — J342 Deviated nasal septum: Secondary | ICD-10-CM

## 2017-09-17 ENCOUNTER — Other Ambulatory Visit: Payer: Self-pay

## 2017-12-01 DIAGNOSIS — E785 Hyperlipidemia, unspecified: Secondary | ICD-10-CM | POA: Diagnosis not present

## 2017-12-01 DIAGNOSIS — Z0000 Encounter for general adult medical examination without abnormal findings: Secondary | ICD-10-CM | POA: Diagnosis not present

## 2017-12-01 DIAGNOSIS — E559 Vitamin D deficiency, unspecified: Secondary | ICD-10-CM | POA: Diagnosis not present

## 2017-12-08 DIAGNOSIS — F900 Attention-deficit hyperactivity disorder, predominantly inattentive type: Secondary | ICD-10-CM | POA: Diagnosis not present

## 2017-12-08 DIAGNOSIS — F3176 Bipolar disorder, in full remission, most recent episode depressed: Secondary | ICD-10-CM | POA: Diagnosis not present

## 2017-12-08 DIAGNOSIS — F3174 Bipolar disorder, in full remission, most recent episode manic: Secondary | ICD-10-CM | POA: Diagnosis not present

## 2017-12-16 DIAGNOSIS — L255 Unspecified contact dermatitis due to plants, except food: Secondary | ICD-10-CM | POA: Diagnosis not present

## 2018-04-24 DIAGNOSIS — Z1231 Encounter for screening mammogram for malignant neoplasm of breast: Secondary | ICD-10-CM | POA: Diagnosis not present

## 2018-04-24 DIAGNOSIS — Z01419 Encounter for gynecological examination (general) (routine) without abnormal findings: Secondary | ICD-10-CM | POA: Diagnosis not present

## 2018-04-24 DIAGNOSIS — Z6830 Body mass index (BMI) 30.0-30.9, adult: Secondary | ICD-10-CM | POA: Diagnosis not present

## 2018-06-12 DIAGNOSIS — J0100 Acute maxillary sinusitis, unspecified: Secondary | ICD-10-CM | POA: Diagnosis not present

## 2018-06-26 DIAGNOSIS — F900 Attention-deficit hyperactivity disorder, predominantly inattentive type: Secondary | ICD-10-CM | POA: Diagnosis not present

## 2018-06-26 DIAGNOSIS — F3176 Bipolar disorder, in full remission, most recent episode depressed: Secondary | ICD-10-CM | POA: Diagnosis not present

## 2018-06-26 DIAGNOSIS — F3174 Bipolar disorder, in full remission, most recent episode manic: Secondary | ICD-10-CM | POA: Diagnosis not present

## 2018-07-19 DIAGNOSIS — G4719 Other hypersomnia: Secondary | ICD-10-CM | POA: Diagnosis not present

## 2018-07-19 DIAGNOSIS — R05 Cough: Secondary | ICD-10-CM | POA: Diagnosis not present

## 2018-08-23 DIAGNOSIS — G4733 Obstructive sleep apnea (adult) (pediatric): Secondary | ICD-10-CM | POA: Diagnosis not present

## 2018-11-22 DIAGNOSIS — M542 Cervicalgia: Secondary | ICD-10-CM | POA: Diagnosis not present

## 2018-11-30 ENCOUNTER — Other Ambulatory Visit: Payer: Self-pay | Admitting: Family Medicine

## 2018-11-30 DIAGNOSIS — M5412 Radiculopathy, cervical region: Secondary | ICD-10-CM

## 2018-12-04 ENCOUNTER — Encounter: Payer: Self-pay | Admitting: Physical Therapy

## 2018-12-04 ENCOUNTER — Ambulatory Visit: Payer: BC Managed Care – PPO | Attending: Family Medicine | Admitting: Physical Therapy

## 2018-12-04 ENCOUNTER — Ambulatory Visit: Payer: BC Managed Care – PPO

## 2018-12-04 ENCOUNTER — Other Ambulatory Visit: Payer: Self-pay

## 2018-12-04 DIAGNOSIS — R293 Abnormal posture: Secondary | ICD-10-CM

## 2018-12-04 DIAGNOSIS — R252 Cramp and spasm: Secondary | ICD-10-CM | POA: Diagnosis not present

## 2018-12-04 DIAGNOSIS — M542 Cervicalgia: Secondary | ICD-10-CM | POA: Insufficient documentation

## 2018-12-04 NOTE — Therapy (Signed)
[facility] [address], [address] [place], [place], [address] Phone: [phone]   Fax:  [phone]  Physical Therapy Evaluation  Patient Details  Name: Kelly Powers MRN: [id number] Date of Birth: 1961-06-07 Referring Provider (PT): Darrow BussingKoirala, Dibas, MD   Encounter Date: 12/04/2018  PT End of Session - 12/04/18 2113    Visit Number  1    Authorization Type  [employer]    Authorization - Number of Visits  30    PT Start Time  1900    PT Stop Time  1950    PT Time Calculation (min)  50 min    Activity Tolerance  Patient tolerated treatment well;Patient limited by pain    Behavior During Therapy  [facility] for tasks assessed/performed       Past Medical History:  Diagnosis Date  . Bipolar 1 disorder [facility](HCC)     Past Surgical History:  Procedure Laterality Date  . ABDOMINAL HYSTERECTOMY      There were no vitals filed for this visit.   Subjective Assessment - 12/04/18 1904    Subjective  pt reports worsening Rt sided neck pain starting approx 6 weeks ago.  Pain is acute but doesn't recall a specific injury.  Pain meds had too many side effects and has also tried prednisone pack.  She is on her last day of pred pack and doesn't feel like it helped.      Pertinent History  bipolar    Limitations  Sitting;Reading;House hold activities    Diagnostic tests  order in for cervical MRI    Patient Stated Goals  relieve pain, less pain at end of day, have more energy due to less pain    Currently in Pain?  Yes    Pain Score  6     Pain Location  Neck    Pain Orientation  Right    Pain Descriptors / Indicators  Aching;Throbbing    Pain Type  Acute pain    Pain Radiating Towards  up the back of right side of skull and out to shoulder    Pain Onset  More than a month ago    Pain Frequency  Constant    Aggravating Factors   working all day, driving, sleeping    Pain Relieving Factors  nothing yet    Effect of Pain on Daily Activities   work, sleep         [facility] - 12/04/18 0001      Assessment   Medical Diagnosis  M54.12 (ICD-10-CM) - Radiculopathy, cervical region    Referring Provider (PT)  Koirala, Dibas, MD    Onset Date/Surgical Date  --   approx 6 weeks ago   Hand Dominance  Right    Next MD Visit  --   nothing yet   Prior Therapy  no, saw a chiro as a child      Precautions   Precautions  None      Restrictions   Weight Bearing Restrictions  No      Balance Screen   Has the patient fallen in the past 6 months  No    Has the patient had a decrease in activity level because of a fear of falling?   No    Is the patient reluctant to leave their home because of a fear of falling?   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    Leisure  going to gym when it was open      Cognition   Overall Cognitive Status  Within Functional Limits for tasks assessed      Observation/Other Assessments   Observations  Rt shoulder elevated    Focus on Therapeutic Outcomes (FOTO)   56%   41% goal     Sensation   Light Touch  Appears Intact      Posture/Postural Control   Posture/Postural Control  Postural limitations    Postural Limitations  Decreased thoracic kyphosis;Rounded Shoulders;Forward head   mild Dowager's hump present     ROM / Strength   AROM / PROM / Strength  AROM;Strength      AROM   Overall AROM Comments  pain with all cervical movements, bil shoulders WFL     AROM Assessment Site  Cervical    Cervical Flexion  25    Cervical Extension  15    Cervical - Right Side Bend  10    Cervical - Left Side Bend  15    Cervical - Right Rotation  20   pain   Cervical - Left Rotation  50   pain     Strength   Overall Strength Comments  cervical  strength resisted isometrics all strong and painful, bil UEs WNL      Palpation   Spinal mobility  Rt A/A joint limited and painful, poor U-joint sideglides on Rt C3-C6, poor upglides C4-C6 Rt>Lt    Palpation comment  TTP: Rt upper trap, scalenes, SO, deep multifidus C4-C6, cervical paraspinals Rt      Special Tests    Special Tests  Cervical    Cervical Tests  Dictraction      Distraction Test   Findngs  Negative    Comment  no change                Objective measurements completed on examination: See above findings.              PT Education - 12/04/18 2113    Education Details  Access Code: [email or web address]     Person(s) Educated  Patient    Methods  Explanation;Demonstration;Verbal cues;Handout;Tactile cues    Comprehension  Verbalized understanding;Returned demonstration       PT Short Term Goals - 12/04/18 2122      PT SHORT TERM GOAL #1   Title  Pt will be ind with initial HEP to improve ROM and begin stabilization training    Time  2    Period  Weeks    Status  New    Target Date  12/18/18      PT SHORT TERM GOAL #2   Title  Pt will achieve at least 30 deg bil rotation to improve tasks such as driving and dynamic work tasks.    Time  4    Period  Weeks    Status  New    Target Date  01/01/19      PT SHORT TERM GOAL #3   Title  Pt will report reduction in pain at end of work day by at least 20%    Time  4    Period  Weeks    Status  New    Target Date  01/01/19        PT Long Term Goals - 12/04/18 2124      PT LONG TERM GOAL #1   Title  Pt will be ind with advanced HEP and understand how to safely progress.    Time  8    Period  Weeks    Status  New    Target Date  01/29/19      PT LONG TERM GOAL #2   Title  Pt will report overall reduction in pain at the end of a work day by at least 50%    Time  8    Period  Weeks    Status  New    Target Date  01/29/19      PT LONG TERM GOAL #3   Title  Pt will achieve cervical flexion to at  least 50 deg and bil rot to at least 60 deg to improve dynamic work tasks, reading, and driving.    Time  8    Period  Weeks    Status  New    Target Date  01/29/19      PT LONG TERM GOAL #4   Title  Pt will reduce FOTO score to </= 41% to demo less limitation.    Time  8    Period  Weeks    Status  New    Target Date  01/29/19             Plan - 12/04/18 2114    Clinical Impression Statement  Pt presents to PT with acute onset without known injury approx 6 weeks ago.  Pain meds and steroid pack have not helped.  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  She presents with upper cervical joint restriction, significant limitation in all cervical planes of motion, strong and painful resisted isometrics in all cervical planes, painful trigger points throughout right sided neck musculature, and postural dysfunction including forward head, mild Dowager's hump and reduced thoracic kyphosis.  She will benefit from skilled PT to reduce pain, improve ROM, posture and function, and increase tolerance of daily demands.    Personal Factors and Comorbidities  Fitness;Comorbidity 1    Comorbidities  bipolar    Examination-Activity Limitations  Lift;Carry;Sit    Examination-Participation Restrictions  Driving;Cleaning;Other   work without pain   Stability/Clinical Decision Making  Stable/Uncomplicated    Clinical Decision Making  Low    Rehab Potential  Excellent    PT Frequency  2x / week    PT Duration  8 weeks    PT Treatment/Interventions  ADLs/Self Care Home Management;Cryotherapy;Electrical Stimulation;Iontophoresis 4mg /ml Dexamethasone;Moist Heat;Traction;Functional mobility training;Therapeutic exercise;Therapeutic activities;Neuromuscular re-education;Patient/family education;Manual techniques;Dry needling;Passive range of motion;Taping;Spinal Manipulations;Joint Manipulations    PT Next Visit Plan  DN 1 Rt  sided cervical, lower cervical segments, upper cervical mobs, TENS/heat or ice, f/u on HEP, gentle cervical ROM and stabilization    PT Home Exercise Plan  Access Code: [email or web address]     Consulted and Agree with Plan of Care  Patient       Patient will benefit from skilled therapeutic intervention in order to improve the following deficits and impairments:  Improper body mechanics, Pain, Decreased mobility, Increased muscle spasms, Postural dysfunction, Decreased range of motion, Decreased strength, Hypomobility, Impaired UE functional use, Impaired flexibility  Visit Diagnosis: Cervicalgia - Plan: PT plan of care cert/re-cert  Abnormal posture - Plan: PT plan of care cert/re-cert  Cramp and spasm - Plan: PT plan of care cert/re-cert     Problem List There are no active problems to display for this patient.   Loistine SimasJohanna Margel Joens, PT 12/04/18 9:34 PM   [facility] [address], [address] [place], [place], [address] Phone: [phone]   Fax:  [phone]  Name: Kelly Powers MRN: [id number] Date of Birth: 12-May-1961

## 2018-12-04 NOTE — Patient Instructions (Signed)
Access Code: [email or web address]  URL: [email or web address]  Date: 12/04/2018  Prepared by: Venetia Night Beuhring   Exercises  Supine Cervical Retraction with Towel - 10 reps - 2 sets - 10 hold - 1x daily - 7x weekly  Seated Cervical Retraction - 10 reps - 3 sets - 1x daily - 7x weekly  Seated Isometric Cervical Sidebending - 10 reps - 2 sets - 5 hold - 1x daily - 7x weekly  Standing Isometric Cervical Rotation - 10 reps - 2 sets - 5 hold - 1x daily - 7x weekly  Neck Rotation - 10 reps - 3 sets - 1x daily - 7x weekly  Patient Education  Trigger Point Dry Needling

## 2018-12-07 ENCOUNTER — Ambulatory Visit: Payer: BC Managed Care – PPO | Admitting: Physical Therapy

## 2018-12-07 ENCOUNTER — Other Ambulatory Visit: Payer: Self-pay

## 2018-12-07 ENCOUNTER — Encounter: Payer: Self-pay | Admitting: Physical Therapy

## 2018-12-07 DIAGNOSIS — M542 Cervicalgia: Secondary | ICD-10-CM

## 2018-12-07 DIAGNOSIS — R293 Abnormal posture: Secondary | ICD-10-CM | POA: Diagnosis not present

## 2018-12-07 DIAGNOSIS — R252 Cramp and spasm: Secondary | ICD-10-CM

## 2018-12-07 NOTE — Therapy (Signed)
[facility] [address], [address] [place], [place], [address] Phone: [phone]   Fax:  [phone]  Physical Therapy Treatment  Patient Details  Name: Kelly Powers MRN: [id number] Date of Birth: 1961-03-01 Referring Provider (PT): Darrow BussingKoirala, Dibas, MD   Encounter Date: 12/07/2018  PT End of Session - 12/07/18 1656    Visit Number  2    Authorization Type  BCBS    PT Start Time  1658    PT Stop Time  1745    PT Time Calculation (min)  47 min    Activity Tolerance  Patient tolerated treatment well;Patient limited by pain    Behavior During Therapy  [facility] for tasks assessed/performed       Past Medical History:  Diagnosis Date  . Bipolar 1 disorder [facility](HCC)     Past Surgical History:  Procedure Laterality Date  . ABDOMINAL HYSTERECTOMY      There were no vitals filed for this visit.  Subjective Assessment - 12/07/18 1657    Subjective  Pt says she was very sore after the evaluation. Doing HEP but cried in pain yesterday.    Pertinent History  bipolar    Limitations  Sitting;Reading;House hold activities    Diagnostic tests  order in for cervical MRI    Patient Stated Goals  relieve pain, less pain at end of day, have more energy due to less pain    Currently in Pain?  Yes    Pain Score  6     Pain Location  Neck    Pain Orientation  Right    Pain Descriptors / Indicators  Aching;Throbbing    Aggravating Factors   working, especially with computers of various heights                       [facility] Adult PT Treatment/Exercise - 12/07/18 0001      Exercises   Exercises  Neck      Neck Exercises: Seated   Cervical Isometrics Limitations  reviewed from HEP    Cervical Rotation  Both;5 reps    Cervical Rotation Limitations  bil with neck retraction      Modalities   Modalities  Electrical Stimulation;Cryotherapy      Cryotherapy   Number Minutes Cryotherapy  10 Minutes    Cryotherapy Location   Cervical    Type of Cryotherapy  Ice pack      Electrical Stimulation   Electrical Stimulation Location  cervical    Electrical Stimulation Action  IFC    Electrical Stimulation Goals  Pain      Manual Therapy   Manual Therapy  Joint mobilization;Soft tissue mobilization    Joint Mobilization  Rt O/A, A/A and C2-3 facet and U joints, Gr II-III with contract/relax    Soft tissue mobilization  elongation massage bil multifidi gentle       Trigger Point Dry Needling - 12/07/18 0001    Consent Given?  Yes    Education Handout Provided  Yes    Muscles Treated Head and Neck  Upper trapezius;Suboccipitals;Cervical multifidi   bil C5-C7   Other Dry Needling  --   SO twitch response, improved tissue length   Upper Trapezius Response  Twitch reponse elicited;Palpable increased muscle length    Cervical multifidi Response  Twitch reponse elicited;Palpable increased muscle length             PT Short Term Goals - 12/07/18 1907  PT SHORT TERM GOAL #1   Title  Pt will be ind with initial HEP to improve ROM and begin stabilization training    Status  Achieved        PT Long Term Goals - 12/04/18 2124      PT LONG TERM GOAL #1   Title  Pt will be ind with advanced HEP and understand how to safely progress.    Time  8    Period  Weeks    Status  New    Target Date  01/29/19      PT LONG TERM GOAL #2   Title  Pt will report overall reduction in pain at the end of a work day by at least 50%    Time  8    Period  Weeks    Status  New    Target Date  01/29/19      PT LONG TERM GOAL #3   Title  Pt will achieve cervical flexion to at least 50 deg and bil rot to at least 60 deg to improve dynamic work tasks, reading, and driving.    Time  8    Period  Weeks    Status  New    Target Date  01/29/19      PT LONG TERM GOAL #4   Title  Pt will reduce FOTO score to </= 41% to demo less limitation.    Time  8    Period  Weeks    Status  New    Target Date  01/29/19             Plan - 12/07/18 1902    Clinical Impression Statement  Pt continues to have high reactivity of tissues in Rt cervical region contributing to pain.  Release of lower cervical multifidi and upper trap with DN today did not seem to have an immediate beneficial effect on her pain.  She has significant restriction in Rt upper cervical joints which was addressed through Gr II/III and contract relax techniques but pain limited these treatment approaches.  PT used TENS and ice which patient liked.  Handout given for home TENS unit Pt may be able to use at work for relief.  She will continue to benefit from manual techniques as tolerated, modalities for pain/inflammation, postural re-ed and body mechanics training.    Rehab Potential  Excellent    PT Frequency  2x / week    PT Duration  8 weeks    PT Treatment/Interventions  ADLs/Self Care Home Management;Cryotherapy;Electrical Stimulation;Iontophoresis 4mg /ml Dexamethasone;Moist Heat;Traction;Functional mobility training;Therapeutic exercise;Therapeutic activities;Neuromuscular re-education;Patient/family education;Manual techniques;Dry needling;Passive range of motion;Taping;Spinal Manipulations;Joint Manipulations    PT Next Visit Plan  f/u on DN 1, TENS and ice, gentle ROM and stabilization as tolerated, consider taping, portable TENS unit at work f/u    PT Home Exercise Plan  Access Code: [email or web address]     Consulted and Agree with Plan of Care  Patient       Patient will benefit from skilled therapeutic intervention in order to improve the following deficits and impairments:     Visit Diagnosis: Cervicalgia  Abnormal posture  Cramp and spasm     Problem List There are no active problems to display for this patient.  Venetia Night Veria Stradley, PT 12/07/18 7:08 PM   [facility] [address] [place], [place], [address] Phone: [phone]   Fax:  [phone]  Name: Kelly Powers MRN: [id number] Date of Birth:  06/15/1961   

## 2018-12-11 ENCOUNTER — Other Ambulatory Visit: Payer: Self-pay

## 2018-12-11 ENCOUNTER — Ambulatory Visit: Payer: BC Managed Care – PPO | Admitting: Physical Therapy

## 2018-12-11 ENCOUNTER — Encounter: Payer: Self-pay | Admitting: Physical Therapy

## 2018-12-11 DIAGNOSIS — R293 Abnormal posture: Secondary | ICD-10-CM

## 2018-12-11 DIAGNOSIS — R252 Cramp and spasm: Secondary | ICD-10-CM

## 2018-12-11 DIAGNOSIS — M542 Cervicalgia: Secondary | ICD-10-CM | POA: Diagnosis not present

## 2018-12-11 NOTE — Therapy (Signed)
[facility] [address], [place], [place], [address] Phone: [phone]   Fax:  [phone]  Physical Therapy Treatment  Patient Details  Name: Kelly Powers MRN: [id number] Date of Birth: Feb 23, 1961 Referring Provider (PT): Lujean Amel, MD   Encounter Date: 12/11/2018  PT End of Session - 12/11/18 1750    Visit Number  3    Authorization Type  BCBS    PT Start Time  8366    PT Stop Time  1852    PT Time Calculation (min)  59 min    Activity Tolerance  Patient tolerated treatment well;Patient limited by pain    Behavior During Therapy  [facility] for tasks assessed/performed       Past Medical History:  Diagnosis Date  . Bipolar 1 disorder [facility])     Past Surgical History:  Procedure Laterality Date  . ABDOMINAL HYSTERECTOMY      There were no vitals filed for this visit.  Subjective Assessment - 12/11/18 1750    Subjective  Pt was very sore over weekend but woke up today in the best place she's been yet since starting PT.  Work day has her in some pain this evening though.  Hasn't bought TENS unit yet but plans to.    Pertinent History  bipolar    Limitations  Sitting;Reading;House hold activities    Patient Stated Goals  relieve pain, less pain at end of day, have more energy due to less pain    Currently in Pain?  Yes    Pain Score  4     Pain Location  Neck    Pain Orientation  Right    Pain Descriptors / Indicators  Aching;Throbbing    Pain Type  Acute pain    Pain Onset  More than a month ago    Aggravating Factors   working, turning head    Effect of Pain on Daily Activities  work, sleep                       [facility] Adult PT Treatment/Exercise - 12/11/18 0001      Exercises   Exercises  Neck;Shoulder      Neck Exercises: Seated   Cervical Rotation  Both;10 reps      Neck Exercises: Supine   Neck Retraction  5 reps;5 secs    Neck Retraction Limitations  on foam roller  along spine, towel roll under neck    Other Supine Exercise  foam roller bil UEs Ys, Ws, Ts with inhale/exhale x 6 rounds      Shoulder Exercises: Supine   Horizontal ABduction  Strengthening;Theraband;Both;20 reps   2x10 on foam roller with neck retraction cue by PT     Shoulder Exercises: Standing   External Rotation  Strengthening;Both;10 reps;Theraband    Theraband Level (Shoulder External Rotation)  Level 2 (Red)    External Rotation Limitations  1x10, red (HEP)    Row  Strengthening;Both;15 reps;Theraband    Theraband Level (Shoulder Row)  Level 2 (Red)    Row Limitations  HEP      Cryotherapy   Number Minutes Cryotherapy  10 Minutes    Cryotherapy Location  Cervical    Type of Cryotherapy  Ice pack      Electrical Stimulation   Electrical Stimulation Location  cervical    Electrical Stimulation Parameters  10 min    Electrical Stimulation Goals  Pain       Trigger  Point Dry Needling - 12/11/18 0001    Consent Given?  Yes    Muscles Treated Head and Neck  Upper trapezius   bil, anterior approach   Upper Trapezius Response  Twitch reponse elicited;Palpable increased muscle length           PT Education - 12/11/18 1822    Education Details  Access Code: [email or web address]    Person(s) Educated  Patient    Methods  Explanation;Demonstration;Verbal cues;Handout;Tactile cues    Comprehension  Verbalized understanding       PT Short Term Goals - 12/11/18 1850      PT SHORT TERM GOAL #2   Title  Pt will achieve at least 30 deg bil rotation to improve tasks such as driving and dynamic work tasks.    Status  On-going        PT Long Term Goals - 12/04/18 2124      PT LONG TERM GOAL #1   Title  Pt will be ind with advanced HEP and understand how to safely progress.    Time  8    Period  Weeks    Status  New    Target Date  01/29/19      PT LONG TERM GOAL #2   Title  Pt will report overall reduction in pain at the end of a work day by at least 50%    Time  8     Period  Weeks    Status  New    Target Date  01/29/19      PT LONG TERM GOAL #3   Title  Pt will achieve cervical flexion to at least 50 deg and bil rot to at least 60 deg to improve dynamic work tasks, reading, and driving.    Time  8    Period  Weeks    Status  New    Target Date  01/29/19      PT LONG TERM GOAL #4   Title  Pt will reduce FOTO score to </= 41% to demo less limitation.    Time  8    Period  Weeks    Status  New    Target Date  01/29/19            Plan - 12/11/18 1847    Clinical Impression Statement  Pt reported much improved pain on waking today after a sore weekend following last session.  PT focused on postural release and strengthening of scapulae and cervical stabilizers combined with dry needling to release anterior aspect of bil upper traps (signif release and twitch response Lt>Rt today).  Pt noted improved ease and range into Lt rotation end of session.  Rt rotation continues to be limited and painful.  PT updated HEP and continued modalities end of session for pain relief.  Pt will continue to benefit from ongoing assessment and skilled PT to address pain and deficits.    Rehab Potential  Excellent    PT Frequency  2x / week    PT Duration  8 weeks    PT Treatment/Interventions  ADLs/Self Care Home Management;Cryotherapy;Electrical Stimulation;Iontophoresis 4mg /ml Dexamethasone;Moist Heat;Traction;Functional mobility training;Therapeutic exercise;Therapeutic activities;Neuromuscular re-education;Patient/family education;Manual techniques;Dry needling;Passive range of motion;Taping;Spinal Manipulations;Joint Manipulations    PT Next Visit Plan  f/u on DN 2 (bil upper traps anterior approach), continue modalities, ROM, postural re-ed and strength, flexibility (add spinal ROM for thoracic spine)    PT Home Exercise Plan  Access Code: [email or web address]  Patient will benefit from skilled therapeutic intervention in order to improve the following deficits and  impairments:     Visit Diagnosis: 1. Cervicalgia   2. Abnormal posture   3. Cramp and spasm        Problem List There are no active problems to display for this patient.   Loistine SimasJohanna Kierstyn Baranowski, PT 12/11/18 6:52 PM   [facility] [address], [address] [place], [place], [address] Phone: [phone]   Fax:  [phone]  Name: Kelly Powers MRN: [id number] Date of Birth: 1960/06/30

## 2018-12-11 NOTE — Patient Instructions (Signed)
Access Code: [email or web address]  URL: [email or web address]  Date: 12/11/2018  Prepared by: Venetia Night Beuhring   Exercises  Supine Cervical Retraction with Towel - 10 reps - 2 sets - 10 hold - 1x daily - 7x weekly  Seated Cervical Retraction - 10 reps - 3 sets - 1x daily - 7x weekly  Seated Isometric Cervical Sidebending - 10 reps - 2 sets - 5 hold - 1x daily - 7x weekly  Standing Isometric Cervical Rotation - 10 reps - 2 sets - 5 hold - 1x daily - 7x weekly  Neck Rotation - 10 reps - 3 sets - 1x daily - 7x weekly  Supine Shoulder Horizontal Abduction with Resistance - 10 reps - 2 sets - 1x daily - 7x weekly  Shoulder External Rotation and Scapular Retraction with Resistance - 10 reps - 3 sets - 1x daily - 7x weekly  Standing Row with Anchored Resistance - 10 reps - 3 sets - 1x daily - 7x weekly  Doorway Pec Stretch at 90 Degrees Abduction - 2 reps - 1 sets - 20 hold - 1x daily - 7x weekly

## 2018-12-14 ENCOUNTER — Ambulatory Visit: Payer: BC Managed Care – PPO | Admitting: Physical Therapy

## 2018-12-14 ENCOUNTER — Encounter: Payer: BC Managed Care – PPO | Admitting: Physical Therapy

## 2018-12-18 ENCOUNTER — Other Ambulatory Visit: Payer: Self-pay

## 2018-12-18 ENCOUNTER — Ambulatory Visit: Payer: BC Managed Care – PPO | Admitting: Physical Therapy

## 2018-12-18 ENCOUNTER — Encounter: Payer: Self-pay | Admitting: Physical Therapy

## 2018-12-18 DIAGNOSIS — M542 Cervicalgia: Secondary | ICD-10-CM

## 2018-12-18 DIAGNOSIS — R293 Abnormal posture: Secondary | ICD-10-CM | POA: Diagnosis not present

## 2018-12-18 DIAGNOSIS — R252 Cramp and spasm: Secondary | ICD-10-CM | POA: Diagnosis not present

## 2018-12-18 NOTE — Therapy (Signed)
[facility] [address], [address] [place], [place], [address] Phone: [phone]   Fax:  [phone]  Physical Therapy Treatment  Patient Details  Name: Kelly Powers MRN: [id number] Date of Birth: 07/18/1960 Referring Provider (PT): Darrow BussingKoirala, Dibas, MD   Encounter Date: 12/18/2018  PT End of Session - 12/18/18 1856    Visit Number  4    Authorization Type  BCBS    PT Start Time  1858    PT Stop Time  1953    PT Time Calculation (min)  55 min    Activity Tolerance  Patient tolerated treatment well    Behavior During Therapy  [facility] for tasks assessed/performed       Past Medical History:  Diagnosis Date  . Bipolar 1 disorder [facility](HCC)     Past Surgical History:  Procedure Laterality Date  . ABDOMINAL HYSTERECTOMY      There were no vitals filed for this visit.  Subjective Assessment - 12/18/18 1859    Subjective  Pt states she is feeling about 40% better since starting PT.  Did have a set back having gone to the hairdresser and leaning backward over sink.    Pertinent History  bipolar    Limitations  Sitting;Reading;House hold activities    Diagnostic tests  order in for cervical MRI    Patient Stated Goals  relieve pain, less pain at end of day, have more energy due to less pain    Currently in Pain?  Yes    Pain Score  4     Pain Location  Neck    Pain Orientation  Right    Pain Descriptors / Indicators  Aching;Nagging    Pain Type  Acute pain    Pain Onset  More than a month ago    Pain Frequency  Constant    Aggravating Factors   turning head to right                       [facility]/Exercise - 12/18/18 0001      Self-Care   Self-Care  Posture;Other Self-Care Comments    Other Self-Care Comments   functional squats and lifting technique with neck retraction, sustained activation of neck retractors      Neck Exercises: Standing   Other Standing Exercises  squats with neck  retraction for functional work training  x 10 reps (HEP)      Cryotherapy   Number Minutes Cryotherapy  10 Minutes    Cryotherapy Location  Cervical    Type of Cryotherapy  Ice pack      Electrical Stimulation   Electrical Stimulation Location  cervical    Electrical Stimulation Action  IFC    Electrical Stimulation Parameters  10 min    Electrical Stimulation Goals  Pain      Manual Therapy   Manual Therapy  Soft tissue mobilization;Joint mobilization;Manual Traction    Joint Mobilization  upper cervical nodding    Soft tissue mobilization  strumming bil scalenes    Manual Traction  Gr I/II mid cervical       Trigger Point Dry Needling - 12/18/18 0001    Consent Given?  Yes    Education Handout Provided  Previously provided    Muscles Treated Head and Neck  Upper trapezius;Cervical multifidi    Upper Trapezius Response  Twitch reponse elicited;Palpable increased muscle length   bil upper traps, anterior/posterior approach Lt, posterior R  Cervical multifidi Response  Twitch reponse elicited;Palpable increased muscle length   C6-T1 bil            PT Short Term Goals - 12/18/18 1904      PT SHORT TERM GOAL #1   Title  Pt will be ind with initial HEP to improve ROM and begin stabilization training    Status  Achieved      PT SHORT TERM GOAL #3   Title  Pt will report reduction in pain at end of work day by at least 20%    Status  Achieved        PT Long Term Goals - 12/04/18 2124      PT LONG TERM GOAL #1   Title  Pt will be ind with advanced HEP and understand how to safely progress.    Time  8    Period  Weeks    Status  New    Target Date  01/29/19      PT LONG TERM GOAL #2   Title  Pt will report overall reduction in pain at the end of a work day by at least 50%    Time  8    Period  Weeks    Status  New    Target Date  01/29/19      PT LONG TERM GOAL #3   Title  Pt will achieve cervical flexion to at least 50 deg and bil rot to at least 60 deg  to improve dynamic work tasks, reading, and driving.    Time  8    Period  Weeks    Status  New    Target Date  01/29/19      PT LONG TERM GOAL #4   Title  Pt will reduce FOTO score to </= 41% to demo less limitation.    Time  8    Period  Weeks    Status  New    Target Date  01/29/19            Plan - 12/18/18 1949    Clinical Impression Statement  Pt with report of 40% improvement since starting PT.  She seems to benefit from release of soft tissues through dry needling and had good release again today of bil multifidi and bil upper traps.  She has ongoing limitation of Rt rotation to 30 deg and Lt rot to 60 deg which has improved from eval.  PT covered proper body mechanics and self-care for work related demands of bending and lifting.  Pt will continue to benefit from skilled PT for manual therapy including DN, postural re-ed and strengthening to support daily demands.    Rehab Potential  Excellent    PT Frequency  2x / week    PT Duration  8 weeks    PT Treatment/Interventions  ADLs/Self Care Home Management;Cryotherapy;Electrical Stimulation;Iontophoresis 4mg /ml Dexamethasone;Moist Heat;Traction;Functional mobility training;Therapeutic exercise;Therapeutic activities;Neuromuscular re-education;Patient/family education;Manual techniques;Dry needling;Passive range of motion;Taping;Spinal Manipulations;Joint Manipulations    PT Next Visit Plan  review HEP, continue manual therapy and functional training for work with improved cervical posture    PT Home Exercise Plan  Access Code: [email or web address]     Consulted and Agree with Plan of Care  Patient       Patient will benefit from skilled therapeutic intervention in order to improve the following deficits and impairments:     Visit Diagnosis: 1. Abnormal posture   2. Cramp and spasm   3. Cervicalgia  Problem List There are no active problems to display for this patient.  Kelly Powers, PT 12/18/18 7:57 PM   [facility] [address], [place], [place], [address] Phone: [phone]   Fax:  [phone]  Name: Kelly Powers MRN: [id number] Date of Birth: 1961/02/05

## 2018-12-19 DIAGNOSIS — F3174 Bipolar disorder, in full remission, most recent episode manic: Secondary | ICD-10-CM | POA: Diagnosis not present

## 2018-12-19 DIAGNOSIS — F3176 Bipolar disorder, in full remission, most recent episode depressed: Secondary | ICD-10-CM | POA: Diagnosis not present

## 2018-12-19 DIAGNOSIS — F900 Attention-deficit hyperactivity disorder, predominantly inattentive type: Secondary | ICD-10-CM | POA: Diagnosis not present

## 2018-12-21 ENCOUNTER — Other Ambulatory Visit: Payer: Self-pay

## 2018-12-21 ENCOUNTER — Encounter: Payer: Self-pay | Admitting: Physical Therapy

## 2018-12-21 ENCOUNTER — Ambulatory Visit: Payer: BC Managed Care – PPO | Admitting: Physical Therapy

## 2018-12-21 DIAGNOSIS — M542 Cervicalgia: Secondary | ICD-10-CM

## 2018-12-21 DIAGNOSIS — R293 Abnormal posture: Secondary | ICD-10-CM | POA: Diagnosis not present

## 2018-12-21 DIAGNOSIS — R252 Cramp and spasm: Secondary | ICD-10-CM | POA: Diagnosis not present

## 2018-12-21 NOTE — Therapy (Signed)
[facility] [address], [address] [place], [place], [address] Phone: [phone]   Fax:  [phone]  Physical Therapy Treatment  Patient Details  Name: Kelly Powers MRN: [id number] Date of Birth: [birth year] Referring Provider (PT): Darrow BussingKoirala, Dibas, MD   Encounter Date: 12/21/2018  PT End of Session - 12/21/18 1753    Visit Number  5    Authorization Type  BCBS    PT Start Time  1755    PT Stop Time  1840    PT Time Calculation (min)  45 min    Activity Tolerance  Patient tolerated treatment well    Behavior During Therapy  [facility] for tasks assessed/performed       Past Medical History:  Diagnosis Date  . Bipolar 1 disorder [facility](HCC)     Past Surgical History:  Procedure Laterality Date  . ABDOMINAL HYSTERECTOMY      There were no vitals filed for this visit.  Subjective Assessment - 12/21/18 1753    Subjective  I am not good.  Went back to day 1 level of pain starting Tues after work.  I may have tweaked it doing the doorway stretch at work that day.    Pertinent History  bipolar    Limitations  Sitting;Reading;House hold activities    Diagnostic tests  MRI scheduled for this Sat    Patient Stated Goals  relieve pain, less pain at end of day, have more energy due to less pain    Currently in Pain?  Yes    Pain Score  10-Worst pain ever    Pain Location  Neck    Pain Orientation  Right    Pain Descriptors / Indicators  Sharp    Pain Type  Acute pain    Pain Radiating Towards  up back of head and out to shoulder    Pain Onset  More than a month ago    Pain Frequency  Constant    Aggravating Factors   turn head to Rt    Pain Relieving Factors  ice, TENS    Effect of Pain on Daily Activities  work, sleep                       [facility] Adult PT Treatment/Exercise - 12/21/18 0001      Neck Exercises: Seated   Cervical Rotation  Both;10 reps    Cervical Rotation Limitations  within painfree range  after DN and manual therapy    Shoulder Rolls  Backwards;10 reps      Shoulder Exercises: Standing   Other Standing Exercises  reviewed doorway stretch, cued to stagger stance and not hang through anterior shoulders      Cryotherapy   Number Minutes Cryotherapy  10 Minutes    Cryotherapy Location  Cervical    Type of Cryotherapy  Ice pack      Electrical Stimulation   Electrical Stimulation Location  cervical    Electrical Stimulation Action  IFC    Electrical Stimulation Parameters  10 min    Electrical Stimulation Goals  Pain      Manual Therapy   Manual Therapy  Myofascial release;Joint mobilization;Taping    Joint Mobilization  right rib springing Gr II/III, PAs T1-T4 Gr II/III    Myofascial Release  Rt upper quadrant in prone, periscapular on Rt    Kinesiotex  Inhibit Muscle   cervical along paraspinals out to bil upper traps      Trigger  Point Dry Needling - 12/21/18 0001    Consent Given?  Yes    Education Handout Provided  Previously provided    Muscles Treated Head and Neck  Suboccipitals    Other Dry Needling  SO muscles: twitch respone elicited, palpable increased muscle length noted             PT Short Term Goals - 12/18/18 1904      PT SHORT TERM GOAL #1   Title  Pt will be ind with initial HEP to improve ROM and begin stabilization training    Status  Achieved      PT SHORT TERM GOAL #3   Title  Pt will report reduction in pain at end of work day by at least 20%    Status  Achieved        PT Long Term Goals - 12/04/18 2124      PT LONG TERM GOAL #1   Title  Pt will be ind with advanced HEP and understand how to safely progress.    Time  8    Period  Weeks    Status  New    Target Date  01/29/19      PT LONG TERM GOAL #2   Title  Pt will report overall reduction in pain at the end of a work day by at least 50%    Time  8    Period  Weeks    Status  New    Target Date  01/29/19      PT LONG TERM GOAL #3   Title  Pt will achieve  cervical flexion to at least 50 deg and bil rot to at least 60 deg to improve dynamic work tasks, reading, and driving.    Time  8    Period  Weeks    Status  New    Target Date  01/29/19      PT LONG TERM GOAL #4   Title  Pt will reduce FOTO score to </= 41% to demo less limitation.    Time  8    Period  Weeks    Status  New    Target Date  01/29/19            Plan - 12/21/18 1807    Clinical Impression Statement  Pt arrived today with report of 10/10 pain after tweaking neck and Rt shoulder at work on Tuesday.  She wondered if she had done her doorway stretch wrong.  PT began with e-stim and ice for pain control and proceeded with upper cervical dry needling on Rt which had signif twitch response and imrpoved tenderness following DN.  PT focused on Rt upper quadrant posterior aspect myofasical and joint restrictions and cued breathing patterns for ribcage mobility and fasical stretching.  She reported reduction of pain from 10/10 to 5/10 end of session.  PT finished with Kinesiotex and explained skin care and precautions with taping.  Pt has MRI for neck this weekend. She will continue to benefit from skilled PT along POC and ongoing monitoring of response to PT.    Rehab Potential  Excellent    PT Frequency  2x / week    PT Duration  8 weeks    PT Treatment/Interventions  ADLs/Self Care Home Management;Cryotherapy;Electrical Stimulation;Iontophoresis 4mg /ml Dexamethasone;Moist Heat;Traction;Functional mobility training;Therapeutic exercise;Therapeutic activities;Neuromuscular re-education;Patient/family education;Manual techniques;Dry needling;Passive range of motion;Taping;Spinal Manipulations;Joint Manipulations    PT Next Visit Plan  f/u on SO DN and Kinesiotex, continue thoracic joint/myofasical release,  breathing techniques for trunk mobility, cervical stab, work training/mechanics, update HEP as needed    PT Home Exercise Plan  Access Code: [email or web address]     Consulted and Agree with  Plan of Care  Patient       Patient will benefit from skilled therapeutic intervention in order to improve the following deficits and impairments:     Visit Diagnosis: 1. Cervicalgia   2. Abnormal posture        Problem List There are no active problems to display for this patient.   Loistine SimasJohanna Beuhring, PT 12/21/18 6:56 PM   [facility] [address], [address] [place], [place], [address] Phone: [phone]   Fax:  [phone]  Name: Kelly Powers MRN: [id number] Date of Birth: 1961-05-28

## 2018-12-23 ENCOUNTER — Other Ambulatory Visit: Payer: Self-pay

## 2018-12-23 ENCOUNTER — Ambulatory Visit
Admission: RE | Admit: 2018-12-23 | Discharge: 2018-12-23 | Disposition: A | Discharge status: Home / Self Care | Payer: BC Managed Care – PPO | Source: Ambulatory Visit | Attending: Family Medicine | Admitting: Family Medicine

## 2018-12-23 DIAGNOSIS — M4802 Spinal stenosis, cervical region: Secondary | ICD-10-CM | POA: Diagnosis not present

## 2018-12-23 DIAGNOSIS — M5412 Radiculopathy, cervical region: Secondary | ICD-10-CM

## 2018-12-25 ENCOUNTER — Ambulatory Visit: Payer: BC Managed Care – PPO | Admitting: Physical Therapy

## 2018-12-25 ENCOUNTER — Encounter: Payer: Self-pay | Admitting: Physical Therapy

## 2018-12-25 ENCOUNTER — Other Ambulatory Visit: Payer: Self-pay

## 2018-12-25 DIAGNOSIS — M542 Cervicalgia: Secondary | ICD-10-CM | POA: Diagnosis not present

## 2018-12-25 DIAGNOSIS — R293 Abnormal posture: Secondary | ICD-10-CM

## 2018-12-25 DIAGNOSIS — R252 Cramp and spasm: Secondary | ICD-10-CM

## 2018-12-25 NOTE — Patient Instructions (Signed)
Access Code: [email or web address]  URL: [email or web address]  Date: 12/25/2018  Prepared by: Venetia Night Beuhring   Exercises  Seated Isometric Cervical Sidebending - 10 reps - 2 sets - 5 hold - 1x daily - 7x weekly  Standing Isometric Cervical Rotation - 10 reps - 2 sets - 5 hold - 1x daily - 7x weekly  Neck Rotation - 10 reps - 3 sets - 1x daily - 7x weekly  Shoulder External Rotation and Scapular Retraction with Resistance - 10 reps - 3 sets - 1x daily - 7x weekly  Standing Row with Anchored Resistance - 10 reps - 3 sets - 1x daily - 7x weekly  Standing Shoulder Horizontal Abduction with Resistance - 10 reps - 3 sets - 1x daily - 7x weekly

## 2018-12-25 NOTE — Therapy (Signed)
[facility] [address] [place], [place], [address] Phone: [phone]   Fax:  [phone]  Physical Therapy Treatment  Patient Details  Name: Kelly Powers MRN: [id number] Date of Birth: [birth year] Referring Provider (PT): Lujean Amel, MD   Encounter Date: 12/25/2018  PT End of Session - 12/25/18 1850    Visit Number  6    Authorization Type  BCBS    PT Start Time  1852    PT Stop Time  1940    PT Time Calculation (min)  48 min    Activity Tolerance  Patient tolerated treatment well    Behavior During Therapy  [facility] for tasks assessed/performed       Past Medical History:  Diagnosis Date  . Bipolar 1 disorder [facility])     Past Surgical History:  Procedure Laterality Date  . ABDOMINAL HYSTERECTOMY      There were no vitals filed for this visit.  Subjective Assessment - 12/25/18 1851    Subjective  I was practically painfree the next day after last visit.  It was a miracle.  I loved the tape and haven't taken it off b/c I didn't want to go back to being in pain.    Pertinent History  bipolar    Limitations  Sitting;Reading;House hold activities    Diagnostic tests  had MRI Sat    Patient Stated Goals  relieve pain, less pain at end of day, have more energy due to less pain    Currently in Pain?  Yes    Pain Score  2     Pain Location  Neck    Pain Orientation  Right    Pain Descriptors / Indicators  Aching    Pain Type  Acute pain;Chronic pain    Pain Onset  More than a month ago    Pain Frequency  Intermittent    Pain Relieving Factors  taping, TENS    Effect of Pain on Daily Activities  work, sleep                       [facility] Adult PT Treatment/Exercise - 12/25/18 0001      Exercises   Exercises  Neck      Neck Exercises: Machines for Strengthening   UBE (Upper Arm Bike)  L1 seated 2 min fwd/bwd each, PT present to cue posture and monitor symptoms      Neck Exercises:  Theraband   Shoulder External Rotation  15 reps;Red    Shoulder External Rotation Limitations  standing, towel rolls inside bil elbows, PT provided TC for scapular retraction/depression    Horizontal ABduction  15 reps    Horizontal ABduction Limitations  yellow      Neck Exercises: Seated   Cervical Rotation  Right;10 reps    Cervical Rotation Limitations  after DN      Manual Therapy   Kinesiotex  Inhibit Muscle   two I strips along cervical paraspinals hairline to T3      Trigger Point Dry Needling - 12/25/18 0001    Consent Given?  Yes    Education Handout Provided  Previously provided    Muscles Treated Head and Neck  Suboccipitals;Cervical multifidi    Other Dry Needling  SO muscles: twitch respone elicited, palpable increased muscle length noted    Cervical multifidi Response  Twitch reponse elicited;Palpable increased muscle length           PT Education -  12/25/18 1947    Education Details  Access Code: [email or web address], updated    Person(s) Educated  Patient    Methods  Explanation;Demonstration;Verbal cues;Tactile cues    Comprehension  Verbalized understanding;Returned demonstration       PT Short Term Goals - 12/18/18 1904      PT SHORT TERM GOAL #1   Title  Pt will be ind with initial HEP to improve ROM and begin stabilization training    Status  Achieved      PT SHORT TERM GOAL #3   Title  Pt will report reduction in pain at end of work day by at least 20%    Status  Achieved        PT Long Term Goals - 12/04/18 2124      PT LONG TERM GOAL #1   Title  Pt will be ind with advanced HEP and understand how to safely progress.    Time  8    Period  Weeks    Status  New    Target Date  01/29/19      PT LONG TERM GOAL #2   Title  Pt will report overall reduction in pain at the end of a work day by at least 50%    Time  8    Period  Weeks    Status  New    Target Date  01/29/19      PT LONG TERM GOAL #3   Title  Pt will achieve cervical flexion to at  least 50 deg and bil rot to at least 60 deg to improve dynamic work tasks, reading, and driving.    Time  8    Period  Weeks    Status  New    Target Date  01/29/19      PT LONG TERM GOAL #4   Title  Pt will reduce FOTO score to </= 41% to demo less limitation.    Time  8    Period  Weeks    Status  New    Target Date  01/29/19            Plan - 12/25/18 1948    Clinical Impression Statement  Pt with signif reduction in pain since last visit.  Thinks it was the taping along bil upper traps that helped.  Pt had not removed tape as instructed and admitted it was because she was so relieved something helped her pain.  PT noted signif reduction in resting tone of bil upper traps today.  Pt with less reactivity of soft tissues with DN today suggesting less dysfunction.  She demonstrates much improved neck posture and was able to tolerate tband strength and UBE today without pain.  She requires tactile and verbal cues to engage scapulae with these exercises but demonstrated improvement by end of session to PT updated HEP to include.  PT performed I strip taping along cervical paraspinals today to allow upper trap skin region to breathe.  Pt will continue to benefit from skilled PT along current POC to address pain and tolerance of daily demands via improved ROM and strength.    Rehab Potential  Excellent    PT Frequency  2x / week    PT Duration  8 weeks    PT Treatment/Interventions  ADLs/Self Care Home Management;Cryotherapy;Electrical Stimulation;Iontophoresis 4mg /ml Dexamethasone;Moist Heat;Traction;Functional mobility training;Therapeutic exercise;Therapeutic activities;Neuromuscular re-education;Patient/family education;Manual techniques;Dry needling;Passive range of motion;Taping;Spinal Manipulations;Joint Manipulations    PT Next Visit Plan  f/u on I strip tape,  continue UBE/tband exercises with stabilization and scapular strength focus as tolerated    PT Home Exercise Plan  Access Code:  [email or web address]     Consulted and Agree with Plan of Care  Patient       Patient will benefit from skilled therapeutic intervention in order to improve the following deficits and impairments:     Visit Diagnosis: 1. Cervicalgia   2. Abnormal posture   3. Cramp and spasm        Problem List There are no active problems to display for this patient.  Loistine SimasJohanna Ikhlas Albo, PT 12/25/18 7:54 PM   [facility] [address], [address] [place], [place], [address] Phone: [phone]   Fax:  [phone]  Name: Kelly Powers MRN: [id number] Date of Birth: October 15, 1960

## 2018-12-28 ENCOUNTER — Other Ambulatory Visit: Payer: Self-pay

## 2018-12-28 ENCOUNTER — Ambulatory Visit: Payer: BC Managed Care – PPO | Attending: Family Medicine | Admitting: Physical Therapy

## 2018-12-28 ENCOUNTER — Encounter: Payer: Self-pay | Admitting: Physical Therapy

## 2018-12-28 DIAGNOSIS — M542 Cervicalgia: Secondary | ICD-10-CM | POA: Insufficient documentation

## 2018-12-28 DIAGNOSIS — R293 Abnormal posture: Secondary | ICD-10-CM | POA: Diagnosis not present

## 2018-12-28 DIAGNOSIS — R252 Cramp and spasm: Secondary | ICD-10-CM | POA: Diagnosis not present

## 2018-12-28 NOTE — Therapy (Signed)
[facility] [address], [address] [place], [place], [address] Phone: [phone]   Fax:  [phone]  Physical Therapy Treatment  Patient Details  Name: Kelly Powers MRN: [id number] Date of Birth: 1960-08-22 Referring Provider (PT): Darrow BussingKoirala, Dibas, MD   Encounter Date: 12/28/2018  PT End of Session - 12/28/18 1753    Visit Number  7    Authorization Type  BCBS    PT Start Time  1755    PT Stop Time  1845    PT Time Calculation (min)  50 min    Activity Tolerance  Patient tolerated treatment well    Behavior During Therapy  [facility] for tasks assessed/performed       Past Medical History:  Diagnosis Date  . Bipolar 1 disorder [facility](HCC)     Past Surgical History:  Procedure Laterality Date  . ABDOMINAL HYSTERECTOMY      There were no vitals filed for this visit.  Subjective Assessment - 12/28/18 1754    Subjective  The tape was great again but I liked the configuration of the first tape better.  I have been a 2/10 at the end of all three of the last work days which is a huge improvement.    Limitations  Sitting;Reading;House hold activities    Diagnostic tests  had MRI Sat    Patient Stated Goals  relieve pain, less pain at end of day, have more energy due to less pain    Currently in Pain?  Yes    Pain Score  2     Pain Location  Neck    Pain Orientation  Right    Pain Descriptors / Indicators  Aching    Pain Type  Acute pain;Chronic pain    Pain Radiating Towards  back of head, shoulder Rt    Pain Onset  More than a month ago    Pain Frequency  Intermittent    Aggravating Factors   look up, turn to Rt    Pain Relieving Factors  taping, DN    Effect of Pain on Daily Activities  work, sleep, but both improving                       [facility] Adult PT Treatment/Exercise - 12/28/18 0001      Neuro Re-ed    Neuro Re-ed Details   cervical stabilizers recruitment pattern with TC and VC by PT to scoop and  elongate back of neck to avoid SCM use      Exercises   Exercises  Neck;Shoulder      Neck Exercises: Machines for Strengthening   UBE (Upper Arm Bike)  L1 seated 2 min fwd/bwd each      Neck Exercises: Theraband   Shoulder Extension  15 reps    Shoulder Extension Limitations  yellow, PT gave TC to avoid shoulder hiking    Shoulder External Rotation  20 reps;Red    Shoulder External Rotation Limitations  standing, towel rolls inside bil elbows, PT provided TC for scapular retraction/depression    Horizontal ABduction  20 reps    Horizontal ABduction Limitations  yellow      Neck Exercises: Supine   Neck Retraction  10 reps;10 secs    Neck Retraction Limitations  PT TC and VC to "scoop" to avoid SCM use bil    Other Supine Exercise  red ball bil UE press in hooklying with TrA cue and neck retraction 10x5 sec holds  Other Supine Exercise  bird dogs x 10 with neck retraction      Manual Therapy   Kinesiotex  Inhibit Muscle   along bil upper traps              PT Short Term Goals - 12/18/18 1904      PT SHORT TERM GOAL #1   Title  Pt will be ind with initial HEP to improve ROM and begin stabilization training    Status  Achieved      PT SHORT TERM GOAL #3   Title  Pt will report reduction in pain at end of work day by at least 20%    Status  Achieved        PT Long Term Goals - 12/28/18 1901      PT LONG TERM GOAL #1   Title  Pt will be ind with advanced HEP and understand how to safely progress.    Status  On-going      PT LONG TERM GOAL #2   Title  Pt will report overall reduction in pain at the end of a work day by at least 50%    Status  On-going      PT LONG TERM GOAL #3   Title  Pt will achieve cervical flexion to at least 50 deg and bil rot to at least 60 deg to improve dynamic work tasks, reading, and driving.    Status  On-going            Plan - 12/28/18 1856    Clinical Impression Statement  Pt with improving pain levels (2/10) even at  end of work days and demonstrates improved Rt neck rotation to 40 deg.  She tends to overuse SCM bil during neck retraction so PT spent time using various verbal and tactile cues for improved recruitment/performance.  Pt was able to overlay UE/LE movement with maintained neck posture/stabilization in supine; however, she tends to overuse SCM when adding resistance bands in standing.  Pt continues to like Kinesiotex so PT reapplied today along bil upper traps. Pt will continue to benefit from skilled PT along POC with ongoing focus of postural control and manual therapy as needed for pain.    PT Frequency  2x / week    PT Duration  8 weeks    PT Treatment/Interventions  ADLs/Self Care Home Management;Cryotherapy;Electrical Stimulation;Iontophoresis 4mg /ml Dexamethasone;Moist Heat;Traction;Functional mobility training;Therapeutic exercise;Therapeutic activities;Neuromuscular re-education;Patient/family education;Manual techniques;Dry needling;Passive range of motion;Taping;Spinal Manipulations;Joint Manipulations    PT Next Visit Plan  f/u on taping and neck retraction, add sit to stand with weight, quadruped neck retraction, standing row and bent over row for work simulation tasks of bend/lift    PT Home Exercise Plan  Access Code: [email or web address]     Consulted and Agree with Plan of Care  Patient       Patient will benefit from skilled therapeutic intervention in order to improve the following deficits and impairments:     Visit Diagnosis: 1. Cervicalgia   2. Abnormal posture        Problem List There are no active problems to display for this patient.   Kelly Powers, PT 12/28/18 7:03 PM    [facility] [address] [place], [place], [address] Phone: [phone]   Fax:  [phone]  Name: Kelly Powers MRN: [id number] Date of Birth: 26-Jun-1961

## 2019-01-01 ENCOUNTER — Ambulatory Visit: Payer: BC Managed Care – PPO | Admitting: Physical Therapy

## 2019-01-01 ENCOUNTER — Encounter: Payer: Self-pay | Admitting: Physical Therapy

## 2019-01-01 ENCOUNTER — Other Ambulatory Visit: Payer: Self-pay

## 2019-01-01 DIAGNOSIS — R252 Cramp and spasm: Secondary | ICD-10-CM

## 2019-01-01 DIAGNOSIS — R293 Abnormal posture: Secondary | ICD-10-CM | POA: Diagnosis not present

## 2019-01-01 DIAGNOSIS — M542 Cervicalgia: Secondary | ICD-10-CM | POA: Diagnosis not present

## 2019-01-01 NOTE — Therapy (Signed)
[facility] [address] [place], [place], [address] Phone: [phone]   Fax:  [phone]  Physical Therapy Treatment  Patient Details  Name: Kelly Powers MRN: [id number] Date of Birth: 04-Aug-1960 Referring Provider (PT): Lujean Amel, MD   Encounter Date: 01/01/2019  PT End of Session - 01/01/19 1859    Visit Number  8    Authorization Type  BCBS    PT Start Time  1900    PT Stop Time  1945    PT Time Calculation (min)  45 min    Activity Tolerance  Patient tolerated treatment well    Behavior During Therapy  [facility] for tasks assessed/performed       Past Medical History:  Diagnosis Date  . Bipolar 1 disorder [facility])     Past Surgical History:  Procedure Laterality Date  . ABDOMINAL HYSTERECTOMY      There were no vitals filed for this visit.  Subjective Assessment - 01/01/19 1900    Subjective  The tape really helps but then I take it off and it hurts again.  I don't know if it is in my head.  I'm worse again with my pain and I feel kind of stuck in this pain.    Pertinent History  bipolar    Limitations  Sitting;Reading;House hold activities    Diagnostic tests  MRI    Patient Stated Goals  relieve pain, less pain at end of day, have more energy due to less pain    Currently in Pain?  Yes    Pain Score  5     Pain Location  Neck    Pain Orientation  Right    Pain Descriptors / Indicators  Aching    Pain Type  Chronic pain    Pain Onset  More than a month ago    Pain Frequency  Intermittent    Aggravating Factors   look up, turn to Rt, end of work day    Pain Relieving Factors  taping, DN, TENS    Effect of Pain on Daily Activities  work, sleep                       [facility] Adult PT Treatment/Exercise - 01/01/19 0001      Neuro Re-ed    Neuro Re-ed Details   deep neck flexor recruitment in supine, neck alignment/retraction with rotation      Neck Exercises: Supine   Cervical  Rotation  Both;10 reps      Manual Therapy   Manual Therapy  Joint mobilization;Soft tissue mobilization;Manual Traction    Joint Mobilization  O/C1 nodding, C1/2 bil, C2/3 Lt, cervical U-joing sideglides, all Gr II/III    Soft tissue mobilization  SO elongation, cervical paraspinal elongation bil    Manual Traction  upper, mid and lower cervical spine Gr II/III    Kinesiotex  Inhibit Muscle   along bil upper traps, videoed with Pt's phone      Trigger Point Dry Needling - 01/01/19 0001    Consent Given?  Yes    Education Handout Provided  Previously provided    Muscles Treated Head and Neck  Suboccipitals;Upper trapezius    Other Dry Needling  bil upper traps, SO muscles on Rt only    Upper Trapezius Response  Twitch reponse elicited;Palpable increased muscle length             PT Short Term Goals - 01/01/19 1957  PT SHORT TERM GOAL #1   Title  Pt will be ind with initial HEP to improve ROM and begin stabilization training    Status  Achieved      PT SHORT TERM GOAL #2   Title  Pt will achieve at least 30 deg bil rotation to improve tasks such as driving and dynamic work tasks.    Status  Achieved      PT SHORT TERM GOAL #3   Title  Pt will report reduction in pain at end of work day by at least 20%    Status  Achieved        PT Long Term Goals - 01/01/19 1957      PT LONG TERM GOAL #1   Title  Pt will be ind with advanced HEP and understand how to safely progress.    Status  On-going      PT LONG TERM GOAL #2   Title  Pt will report overall reduction in pain at the end of a work day by at least 50%    Status  On-going      PT LONG TERM GOAL #3   Title  Pt will achieve cervical flexion to at least 50 deg and bil rot to at least 60 deg to improve dynamic work tasks, reading, and driving.    Status  On-going            Plan - 01/01/19 1957    Clinical Impression Statement  Pt regressed since last visit with increased pain, reporting 5/10 tonight at  end of work day.  This is an improvement from initial eval but Pt is discouraged.  MRI from last week correlates arthritic/degenerative changes with clinical findings of significant extensive joint restrictions throughout c-spine limiting gross ROM in all planes especially into extension, Rt sidebending, Rt rotation.  She has signif trigger points which improve with manual therapy and use of Kinesiotape between appointments but relief is not long lasting.  Pt hopes to get appt with neurosurgeon to review further treatment options.  PT focused on joint and soft tissue mobilization coupled with dry needling to subocciptials and upper traps today.  Pt reported reduction of pain from 5/10 to 3/10 end of session.  We video taped application of Kinesiotape for husband to help with this at home.  pt will continue to benefit from skilled PT for pain, ROM and stabilization/posture training along current POC.    Rehab Potential  Excellent    PT Frequency  2x / week    PT Duration  8 weeks    PT Treatment/Interventions  ADLs/Self Care Home Management;Cryotherapy;Electrical Stimulation;Iontophoresis 4mg /ml Dexamethasone;Moist Heat;Traction;Functional mobility training;Therapeutic exercise;Therapeutic activities;Neuromuscular re-education;Patient/family education;Manual techniques;Dry needling;Passive range of motion;Taping;Spinal Manipulations;Joint Manipulations    PT Next Visit Plan  review HEP, continue manual PT, modalities, how to progress HEP    PT Home Exercise Plan  Access Code: [email or web address]     Consulted and Agree with Plan of Care  Patient       Patient will benefit from skilled therapeutic intervention in order to improve the following deficits and impairments:     Visit Diagnosis: 1. Cervicalgia   2. Abnormal posture   3. Cramp and spasm        Problem List There are no active problems to display for this patient.   Loistine SimasJohanna Domenico Achord, PT 01/01/19 8:06 PM   [facility] [address] [place], [place], [address] Phone: [phone]  Fax:  [phone]  Name: Silvano BilisKaren L Taff MRN: [id number] Date of Birth: [birth year]

## 2019-01-03 DIAGNOSIS — Z0000 Encounter for general adult medical examination without abnormal findings: Secondary | ICD-10-CM | POA: Diagnosis not present

## 2019-01-03 DIAGNOSIS — E559 Vitamin D deficiency, unspecified: Secondary | ICD-10-CM | POA: Diagnosis not present

## 2019-01-03 DIAGNOSIS — E785 Hyperlipidemia, unspecified: Secondary | ICD-10-CM | POA: Diagnosis not present

## 2019-01-03 DIAGNOSIS — Z6831 Body mass index (BMI) 31.0-31.9, adult: Secondary | ICD-10-CM | POA: Diagnosis not present

## 2019-01-04 ENCOUNTER — Ambulatory Visit: Payer: BC Managed Care – PPO | Admitting: Physical Therapy

## 2019-01-08 ENCOUNTER — Encounter: Payer: Self-pay | Admitting: Physical Therapy

## 2019-01-08 ENCOUNTER — Other Ambulatory Visit: Payer: Self-pay

## 2019-01-08 ENCOUNTER — Ambulatory Visit: Payer: BC Managed Care – PPO | Admitting: Physical Therapy

## 2019-01-08 DIAGNOSIS — R293 Abnormal posture: Secondary | ICD-10-CM

## 2019-01-08 DIAGNOSIS — M542 Cervicalgia: Secondary | ICD-10-CM | POA: Diagnosis not present

## 2019-01-08 DIAGNOSIS — R252 Cramp and spasm: Secondary | ICD-10-CM | POA: Diagnosis not present

## 2019-01-08 NOTE — Patient Instructions (Signed)
Access Code: [email or web address]  URL: [email or web address]  Date: 01/08/2019  Prepared by: Venetia Night Nira Visscher   Exercises  Seated Isometric Cervical Sidebending - 10 reps - 2 sets - 5 hold - 1x daily - 7x weekly  Standing Isometric Cervical Rotation - 10 reps - 2 sets - 5 hold - 1x daily - 7x weekly  Neck Rotation - 10 reps - 3 sets - 1x daily - 7x weekly  Seated Shoulder Rolls - 10 reps - 2 sets - 1x daily - 7x weekly  Seated Scapular Retraction - 10 reps - 2 sets - 1x daily - 7x weekly  Seated Single Arm Shoulder Row with Anchored Resistance - 10 reps - 2 sets - 1x daily - 7x weekly  Standing Bent Over Shoulder Row - 10 reps - 2 sets - 1x daily - 7x weekly  Standing Row with Anchored Resistance - 10 reps - 3 sets - 1x daily - 7x weekly  Standing Shoulder Horizontal Abduction with Resistance - 10 reps - 3 sets - 1x daily - 7x weekly  Shoulder External Rotation and Scapular Retraction with Resistance - 10 reps - 3 sets - 1x daily - 7x weekly

## 2019-01-08 NOTE — Therapy (Signed)
[facility] [address], [place], [place], [address] Phone: [phone]   Fax:  [phone]  Physical Therapy Treatment  Patient Details  Name: Kelly Powers MRN: [id number] Date of Birth: 09-03-1960 Referring Provider (PT): Lujean Amel, MD   Encounter Date: 01/08/2019  PT End of Session - 01/08/19 1750    Visit Number  9    Authorization Type  BCBS    PT Start Time  1750    PT Stop Time  1840    PT Time Calculation (min)  50 min    Activity Tolerance  Patient tolerated treatment well    Behavior During Therapy  [facility] for tasks assessed/performed       Past Medical History:  Diagnosis Date  . Bipolar 1 disorder [facility])     Past Surgical History:  Procedure Laterality Date  . ABDOMINAL HYSTERECTOMY      There were no vitals filed for this visit.  Subjective Assessment - 01/08/19 1750    Subjective  My doctor gave me an Rx for Naprosyn and it helps so much.  He only wants me to take it as needed so I'm using it sparingly.  Between that and the taping on my neck I have good pain control.    Pertinent History  bipolar    Diagnostic tests  MRI    Patient Stated Goals  relieve pain, less pain at end of day, have more energy due to less pain    Currently in Pain?  Yes    Pain Score  1     Pain Location  Neck    Pain Orientation  Right    Pain Descriptors / Indicators  Aching    Pain Type  Chronic pain    Pain Onset  More than a month ago    Pain Frequency  Intermittent    Aggravating Factors   turning to Rt    Pain Relieving Factors  meds, taping, TENS    Effect of Pain on Daily Activities  work tasks, sleep         [facility] PT Assessment - 01/08/19 0001      Observation/Other Assessments   Focus on Therapeutic Outcomes (FOTO)   35%      AROM   Overall AROM Comments  0    AROM Assessment Site  Cervical    Cervical Flexion  25    Cervical Extension  30    Cervical - Right Side Bend  10    Cervical - Left Side Bend  15    Cervical - Right Rotation  35    Cervical - Left Rotation  50      Strength   Overall Strength Comments  WFL cervical isometric strength all cardinal planes                   [facility] Adult PT Treatment/Exercise - 01/08/19 0001      Exercises   Exercises  Neck;Shoulder      Neck Exercises: Machines for Strengthening   UBE (Upper Arm Bike)  L1 x 4' alt each min, PT cued posture and avoidance of overuse of upper traps, discussed progress and pain control      Neck Exercises: Seated   Cervical Isometrics Limitations  reviewed from HEP    Shoulder Rolls  Backwards;10 reps    Shoulder Rolls Limitations  added to HEP      Shoulder Exercises: Seated   Retraction  Both;15 reps  Retraction Limitations  added to HEP    Row  Both;15 reps    Row Limitations  simulated, no resistance, added to HEP      Shoulder Exercises: Standing   Row Limitations  bent over row without resistance, focus on neck posture, added to HEP, 10 reps      Manual Therapy   Myofascial Release  self-release to SO muscle groups on theraballs (HEP)    Manual Traction  upper, mid and lower cervical spine Gr II/III    Kinesiotex  Inhibit Muscle   along bil upper traps            PT Education - 01/08/19 1854    Education Details  Access Code: [email or web address]    Person(s) Educated  Patient    Methods  Explanation;Demonstration;Verbal cues;Handout    Comprehension  Verbalized understanding;Returned demonstration       PT Short Term Goals - 01/08/19 1834      PT SHORT TERM GOAL #1   Title  Pt will be ind with initial HEP to improve ROM and begin stabilization training    Status  Achieved      PT SHORT TERM GOAL #2   Title  Pt will achieve at least 30 deg bil rotation to improve tasks such as driving and dynamic work tasks.    Status  Achieved      PT SHORT TERM GOAL #3   Title  Pt will report reduction in pain at end of work day by at least 20%    Status  Achieved         PT Long Term Goals - 01/08/19 1833      PT LONG TERM GOAL #1   Title  Pt will be ind with advanced HEP and understand how to safely progress.    Status  Achieved      PT LONG TERM GOAL #2   Title  Pt will report overall reduction in pain at the end of a work day by at least 50%    Status  Achieved      PT LONG TERM GOAL #3   Title  Pt will achieve cervical flexion to at least 50 deg and bil rot to at least 60 deg to improve dynamic work tasks, reading, and driving.    Status  Partially Met      PT LONG TERM GOAL #4   Title  Pt will reduce FOTO score to </= 41% to demo less limitation.    Baseline  35%    Status  Achieved            Plan - 01/08/19 1848    Clinical Impression Statement  Pt wished to d/c PT today to preserve PT visits over course of calendar year since insurance limits her to 30 visits.  She has much improved pain control with recent addition of Rx anti-inflammatory.  She met most LTGs but continues to have limited ROM secondary to diffuse arthritic changes in c-spine.  She reports at least 50% pain relief at end of her work days and FOTO score reduced from 56% to 35% limitation, exceeding LTG.  She has been instructed in HEP and given guidance of how to progress self as strength improves.  We discussed work postures and movement patterns to protect her spine.  She hopes to see a neurosurgeon to further guide her and will return to PT with a new Rx as needed.    PT Frequency  2x / week  PT Duration  8 weeks    PT Treatment/Interventions  ADLs/Self Care Home Management;Cryotherapy;Electrical Stimulation;Iontophoresis 58m/ml Dexamethasone;Moist Heat;Traction;Functional mobility training;Therapeutic exercise;Therapeutic activities;Neuromuscular re-education;Patient/family education;Manual techniques;Dry needling;Passive range of motion;Taping;Spinal Manipulations;Joint Manipulations    PT Next Visit Plan  d/c to HEP    PT Home Exercise Plan  Access Code:  [email or web address]    Consulted and Agree with Plan of Care  Patient       Patient will benefit from skilled therapeutic intervention in order to improve the following deficits and impairments:     Visit Diagnosis: 1. Cervicalgia   2. Abnormal posture        Problem List There are no active problems to display for this patient.   JVenetia NightE Inda Mcglothen 01/08/2019, 6:54 PM   PHYSICAL THERAPY DISCHARGE SUMMARY  Visits from Start of Care: 9  Current functional level related to goals / functional outcomes: See above   Remaining deficits: See above   Education / Equipment: HEP Plan: Patient agrees to discharge.  Patient goals were partially met. Patient is being discharged due to meeting the stated rehab goals.  ?????        [facility] [address] [place] [address]Phone: [phone]  Fax:  [phone] Name: Kelly ELSBERRYMRN: [id number] of Birth: 517-Mar-1962

## 2019-01-11 ENCOUNTER — Encounter: Payer: BC Managed Care – PPO | Admitting: Physical Therapy

## 2019-01-15 ENCOUNTER — Ambulatory Visit: Payer: BC Managed Care – PPO | Admitting: Physical Therapy

## 2019-02-05 ENCOUNTER — Encounter: Payer: BC Managed Care – PPO | Admitting: Physical Therapy

## 2019-04-02 DIAGNOSIS — M542 Cervicalgia: Secondary | ICD-10-CM | POA: Diagnosis not present

## 2019-04-02 DIAGNOSIS — M47812 Spondylosis without myelopathy or radiculopathy, cervical region: Secondary | ICD-10-CM | POA: Diagnosis not present

## 2019-04-02 DIAGNOSIS — M5412 Radiculopathy, cervical region: Secondary | ICD-10-CM | POA: Diagnosis not present

## 2019-04-25 ENCOUNTER — Other Ambulatory Visit: Payer: Self-pay

## 2019-04-25 DIAGNOSIS — Z20822 Contact with and (suspected) exposure to covid-19: Secondary | ICD-10-CM

## 2019-04-27 LAB — NOVEL CORONAVIRUS, NAA: SARS-CoV-2, NAA: NOT DETECTED

## 2019-05-07 DIAGNOSIS — Z1231 Encounter for screening mammogram for malignant neoplasm of breast: Secondary | ICD-10-CM | POA: Diagnosis not present

## 2019-05-07 DIAGNOSIS — Z01419 Encounter for gynecological examination (general) (routine) without abnormal findings: Secondary | ICD-10-CM | POA: Diagnosis not present

## 2019-05-07 DIAGNOSIS — Z6833 Body mass index (BMI) 33.0-33.9, adult: Secondary | ICD-10-CM | POA: Diagnosis not present

## 2019-06-12 DIAGNOSIS — F3131 Bipolar disorder, current episode depressed, mild: Secondary | ICD-10-CM | POA: Diagnosis not present

## 2019-06-12 DIAGNOSIS — F900 Attention-deficit hyperactivity disorder, predominantly inattentive type: Secondary | ICD-10-CM | POA: Diagnosis not present

## 2019-06-12 DIAGNOSIS — F3174 Bipolar disorder, in full remission, most recent episode manic: Secondary | ICD-10-CM | POA: Diagnosis not present

## 2019-08-29 DIAGNOSIS — E7800 Pure hypercholesterolemia, unspecified: Secondary | ICD-10-CM | POA: Diagnosis not present

## 2019-09-17 DIAGNOSIS — G43019 Migraine without aura, intractable, without status migrainosus: Secondary | ICD-10-CM | POA: Diagnosis not present

## 2019-12-03 DIAGNOSIS — F3131 Bipolar disorder, current episode depressed, mild: Secondary | ICD-10-CM | POA: Diagnosis not present

## 2019-12-03 DIAGNOSIS — F410 Panic disorder [episodic paroxysmal anxiety] without agoraphobia: Secondary | ICD-10-CM | POA: Diagnosis not present

## 2019-12-03 DIAGNOSIS — F3111 Bipolar disorder, current episode manic without psychotic features, mild: Secondary | ICD-10-CM | POA: Diagnosis not present

## 2019-12-03 DIAGNOSIS — F900 Attention-deficit hyperactivity disorder, predominantly inattentive type: Secondary | ICD-10-CM | POA: Diagnosis not present

## 2020-01-21 DIAGNOSIS — E559 Vitamin D deficiency, unspecified: Secondary | ICD-10-CM | POA: Diagnosis not present

## 2020-01-21 DIAGNOSIS — Z0000 Encounter for general adult medical examination without abnormal findings: Secondary | ICD-10-CM | POA: Diagnosis not present

## 2020-01-21 DIAGNOSIS — E7800 Pure hypercholesterolemia, unspecified: Secondary | ICD-10-CM | POA: Diagnosis not present

## 2020-01-21 DIAGNOSIS — Z13220 Encounter for screening for lipoid disorders: Secondary | ICD-10-CM | POA: Diagnosis not present

## 2020-01-21 DIAGNOSIS — Z23 Encounter for immunization: Secondary | ICD-10-CM | POA: Diagnosis not present

## 2020-04-14 DIAGNOSIS — B009 Herpesviral infection, unspecified: Secondary | ICD-10-CM | POA: Diagnosis not present

## 2020-04-14 DIAGNOSIS — K219 Gastro-esophageal reflux disease without esophagitis: Secondary | ICD-10-CM | POA: Diagnosis not present

## 2020-04-14 DIAGNOSIS — Z23 Encounter for immunization: Secondary | ICD-10-CM | POA: Diagnosis not present

## 2020-04-15 ENCOUNTER — Other Ambulatory Visit: Payer: Self-pay | Admitting: Family Medicine

## 2020-04-16 ENCOUNTER — Other Ambulatory Visit: Payer: Self-pay | Admitting: Family Medicine

## 2020-04-16 DIAGNOSIS — K219 Gastro-esophageal reflux disease without esophagitis: Secondary | ICD-10-CM

## 2020-05-05 ENCOUNTER — Ambulatory Visit
Admission: RE | Admit: 2020-05-05 | Discharge: 2020-05-05 | Disposition: A | Discharge status: Home / Self Care | Payer: BC Managed Care – PPO | Source: Ambulatory Visit | Attending: Family Medicine | Admitting: Family Medicine

## 2020-05-05 DIAGNOSIS — K219 Gastro-esophageal reflux disease without esophagitis: Secondary | ICD-10-CM | POA: Diagnosis not present

## 2020-05-09 IMAGING — MR MRI CERVICAL SPINE WITHOUT CONTRAST
4 of 5 series · 29 of 48 positions shown · non-contrast
Comparison: Cervical spine radiographs 11/22/2018

CLINICAL DATA: Neck and right shoulder pain for 8 weeks.

EXAM:
MRI CERVICAL SPINE WITHOUT CONTRAST
TECHNIQUE: Multiplanar, multisequence MR imaging of the cervical spine was
performed. No intravenous contrast was administered.

[Series 2: T2 · sagittal · 3.0mm · 0.66mm/px · 8 of 15 slices shown (1 of 2)]
[im 1/15]
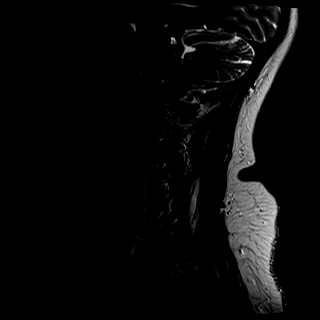
[im 3/15]
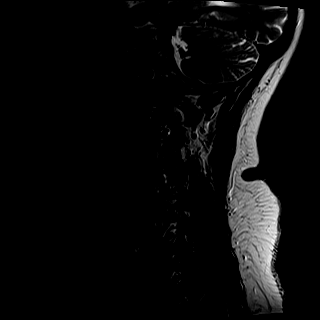
[im 5/15]
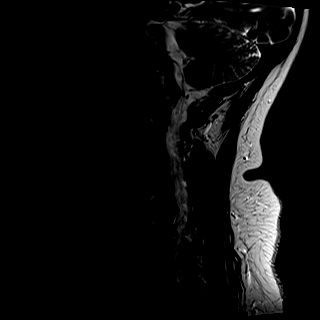
[im 7/15]
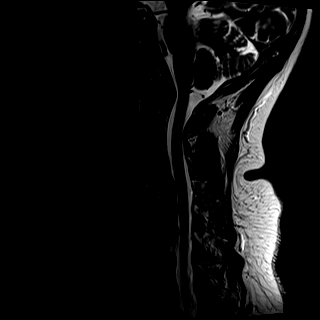
[im 9/15]
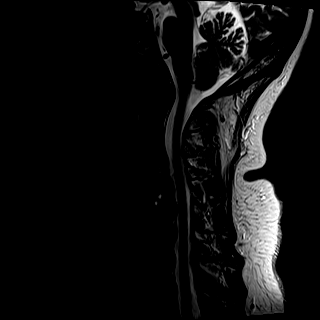
[im 11/15]
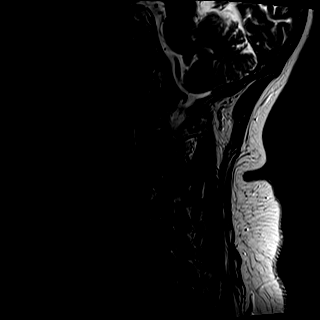
[im 13/15]
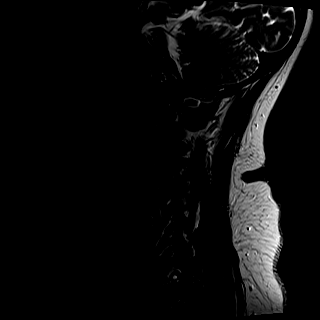
[im 15/15]
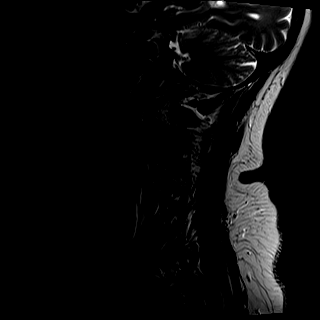

[Series 3: STIR · sagittal · 3.0mm · 0.41mm/px · 5 of 15 slices shown]
[im 1/15]
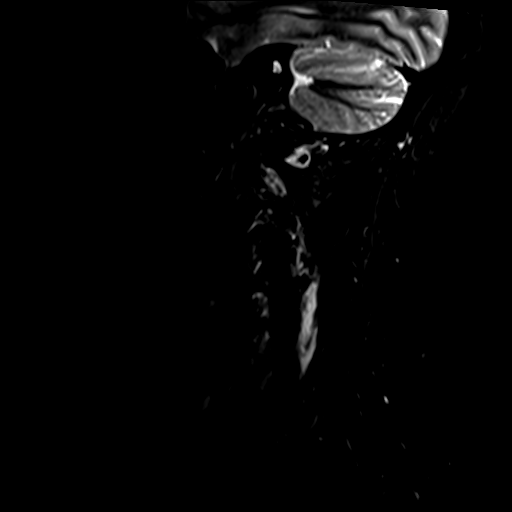
[im 3/15]
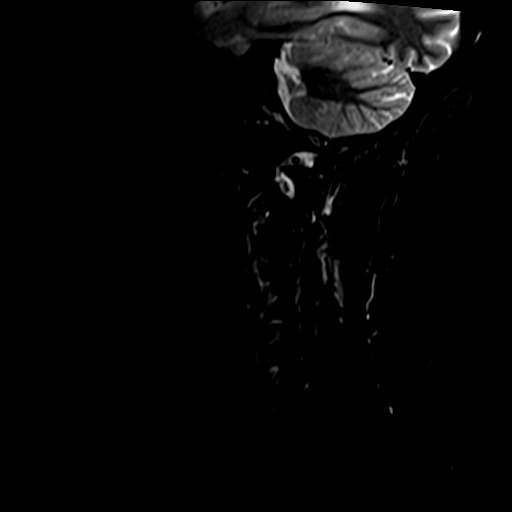
[im 5/15]
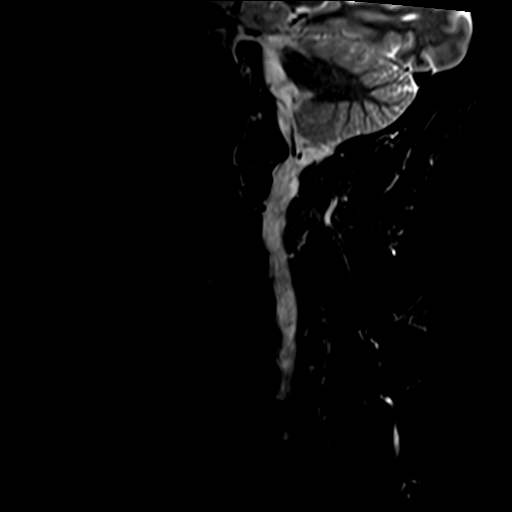
[im 8/15]
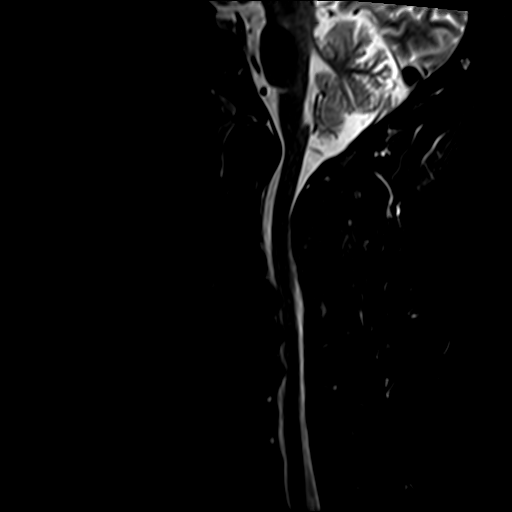
[im 12/15]
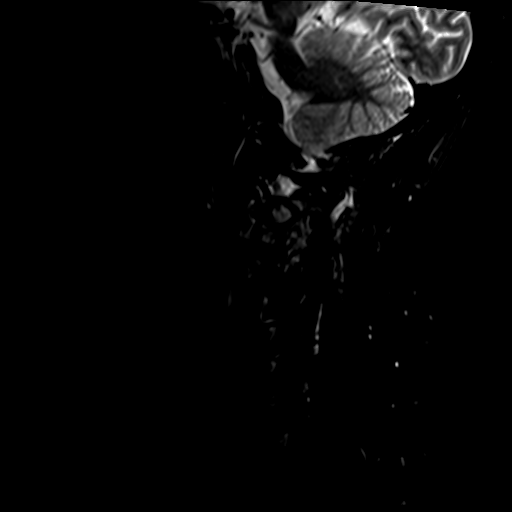

[Series 4: T1 · sagittal · 3.0mm · 0.41mm/px · 7 of 15 slices shown]
[im 1/15]
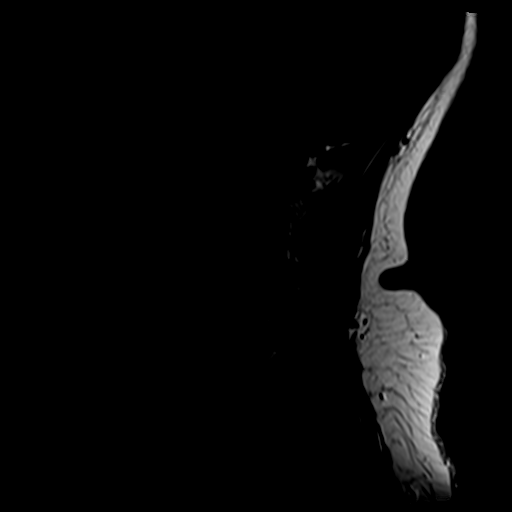
[im 3/15]
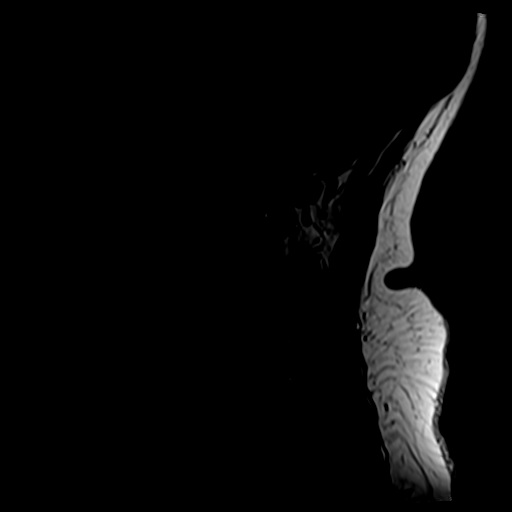
[im 5/15]
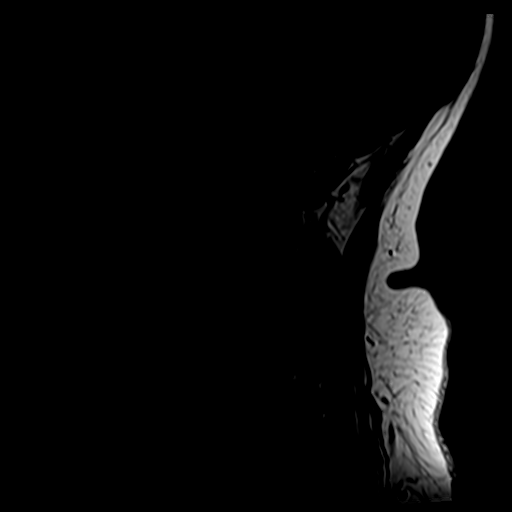
[im 8/15]
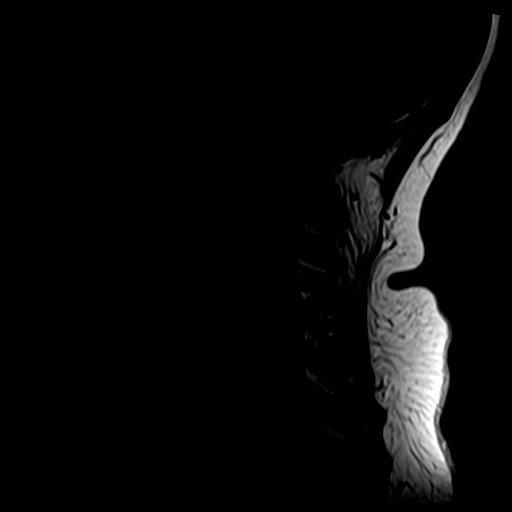
[im 10/15]
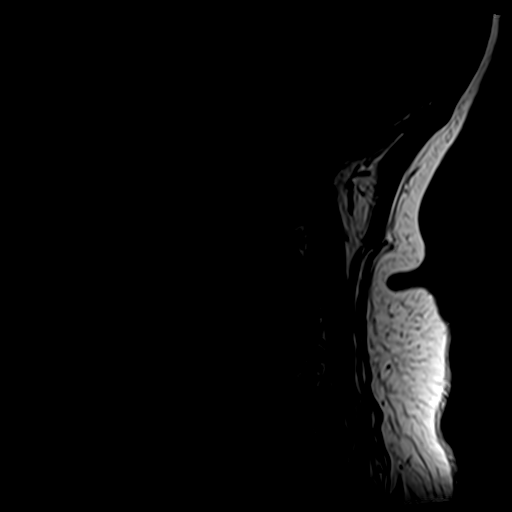
[im 12/15]
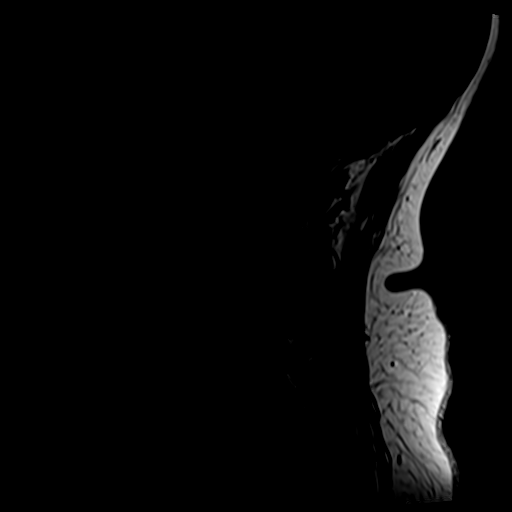
[im 15/15]
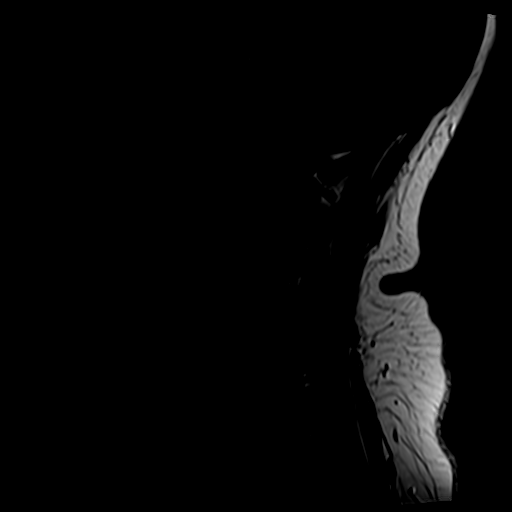

[Series 6: T2 · axial · 3.0mm · 0.70mm/px · z∈[-68,+30]mm · 9 of 28 slices shown (2 of 2)]
[im 1/28]
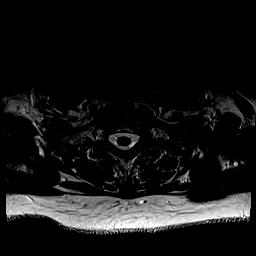
[im 5/28]
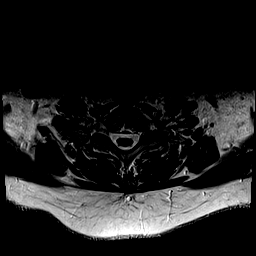
[im 10/28]
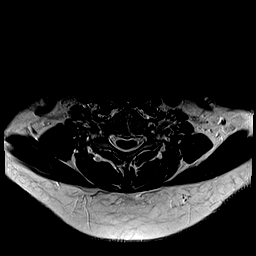
[im 12/28]
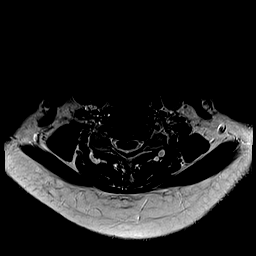
[im 14/28]
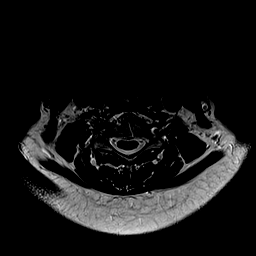
[im 16/28]
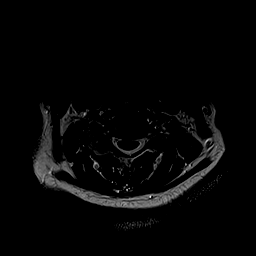
[im 19/28]
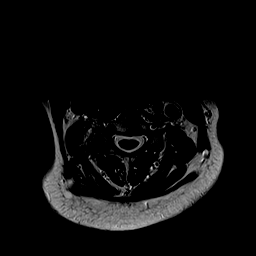
[im 23/28]
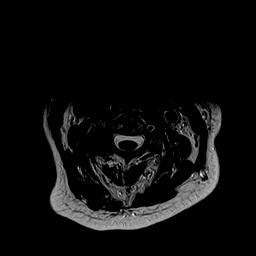
[im 28/28]
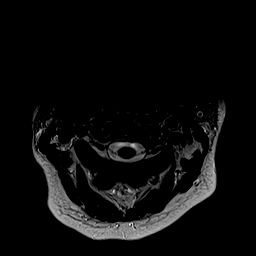

[29 of 48 positions shown; findings below may reference images not displayed]

FINDINGS: Alignment: Reversal of the cervical lordosis. No significant
listhesis.

Vertebrae: No fracture or suspicious osseous lesion. Extensive
right-sided facet edema at C2-3 with edema in the adjacent soft
tissues as well. Moderate disc space narrowing from C4-5 to C6-7
with mild chronic degenerative endplate changes.

Cord: Normal signal.

Posterior Fossa, vertebral arteries, paraspinal tissues: Preserved
vertebral artery flow voids. Unremarkable included posterior fossa.

Disc levels:

C2-3: Severe right facet arthrosis results in mild right neural
foraminal stenosis. No spinal stenosis.

C3-4: Right greater than left uncovertebral spurring and severe
right and moderate left facet arthrosis result in moderate right and
mild left neural foraminal stenosis without spinal stenosis.

C4-5: A small central disc protrusion/disc osteophyte complex mildly
indents the spinal cord without significant spinal stenosis. Mild
uncovertebral spurring and severe right facet arthrosis result in
moderate right neural foraminal stenosis.

C5-6: Broad-based posterior disc osteophyte complex and asymmetric
left uncovertebral spurring result in mild spinal stenosis and mild
right and moderate left neural foraminal stenosis.

C6-7: Mild disc bulging and uncovertebral spurring result in mild
bilateral neural foraminal stenosis. A small central disc osteophyte
complex results in mild cord flattening and mild spinal stenosis.

C7-T1: Mild disc bulging and mild-to-moderate left facet arthrosis
without stenosis.
IMPRESSION: 1. Severe right-sided facet arthrosis in the upper cervical spine
with prominent facet edema at C2-3 which may be a source of neck
pain.
2. Moderate right neural foraminal stenosis at C3-4 and C4-5.
3. Mild spinal stenosis at C5-6 and C6-7.

## 2020-05-12 DIAGNOSIS — Z6830 Body mass index (BMI) 30.0-30.9, adult: Secondary | ICD-10-CM | POA: Diagnosis not present

## 2020-05-12 DIAGNOSIS — Z1231 Encounter for screening mammogram for malignant neoplasm of breast: Secondary | ICD-10-CM | POA: Diagnosis not present

## 2020-05-12 DIAGNOSIS — Z01419 Encounter for gynecological examination (general) (routine) without abnormal findings: Secondary | ICD-10-CM | POA: Diagnosis not present

## 2020-05-26 DIAGNOSIS — Z20828 Contact with and (suspected) exposure to other viral communicable diseases: Secondary | ICD-10-CM | POA: Diagnosis not present

## 2020-05-26 DIAGNOSIS — F3174 Bipolar disorder, in full remission, most recent episode manic: Secondary | ICD-10-CM | POA: Diagnosis not present

## 2020-05-26 DIAGNOSIS — F900 Attention-deficit hyperactivity disorder, predominantly inattentive type: Secondary | ICD-10-CM | POA: Diagnosis not present

## 2020-05-26 DIAGNOSIS — F3176 Bipolar disorder, in full remission, most recent episode depressed: Secondary | ICD-10-CM | POA: Diagnosis not present

## 2020-05-27 ENCOUNTER — Encounter: Payer: Self-pay | Admitting: Nurse Practitioner

## 2020-05-27 ENCOUNTER — Telehealth (HOSPITAL_COMMUNITY): Payer: Self-pay

## 2020-05-27 ENCOUNTER — Other Ambulatory Visit: Payer: Self-pay | Admitting: Nurse Practitioner

## 2020-05-27 DIAGNOSIS — U071 COVID-19: Secondary | ICD-10-CM

## 2020-05-27 NOTE — Progress Notes (Signed)
I connected by phone with Silvano Bilis on 05/27/2020 at 8:04 PM to discuss the potential use of a treatment for mild to moderate COVID-19 viral infection in non-hospitalized patients.  This patient is a 59 y.o. female that meets the FDA criteria for Emergency Use Authorization of bamlanivimab/etesevimab, casirivimab\imdevimab, or sotrovimab  Has a (+) direct SARS-CoV-2 viral test result  Has mild or moderate COVID-19   Is ? 59 years of age and weighs ? 40 kg  Is NOT hospitalized due to COVID-19  Is NOT requiring oxygen therapy or requiring an increase in baseline oxygen flow rate due to COVID-19  Is within 10 days of symptom onset  Has at least one of the high risk factor(s) for progression to severe COVID-19 and/or hospitalization as defined in EUA.  Specific high risk criteria : BMI > 25   I have spoken and communicated the following to the patient or parent/caregiver:  1. FDA has authorized the emergency use of bamlanivimab/etesevimab, casirivimab\imdevimab, or sotrovimab for the treatment of mild to moderate COVID-19 in adults and pediatric patients with positive results of direct SARS-CoV-2 viral testing who are 40 years of age and older weighing at least 40 kg, and who are at high risk for progressing to severe COVID-19 and/or hospitalization.  2. The significant known and potential risks and benefits of bamlanivimab/etesevimab, casirivimab\imdevimab, or sotrovimab, and the extent to which such potential risks and benefits are unknown.  3. Information on available alternative treatments and the risks and benefits of those alternatives, including clinical trials.  4. Patients treated with bamlanivimab/etesevimab, casirivimab\imdevimab, or sotrovimab should continue to self-isolate and use infection control measures (e.g., wear mask, isolate, social distance, avoid sharing personal items, clean and disinfect "high touch" surfaces, and frequent handwashing) according to CDC  guidelines.   5. The patient or parent/caregiver has the option to accept or refuse bamlanivimab/etesevimab, casirivimab\imdevimab, or sotrovimab.  After reviewing this information with the patient, the patient has agreed to receive one of the available covid 19 monoclonal antibodies and will be provided an appropriate fact sheet prior to infusion.Consuello Masse, DNP, AGNP-C [phone] (Infusion Center Hotline)

## 2020-05-27 NOTE — Telephone Encounter (Signed)
Called to Discuss with patient about Covid symptoms and the use of the monoclonal antibody infusion for those with mild to moderate Covid symptoms and at a high risk of hospitalization.     Pt appears to qualify for this infusion due to co-morbid conditions and/or a member of an at-risk group in accordance with the FDA Emergency Use Authorization.    Pt stated that her symptoms started on 11/25, she tested positive on 11/29 with a home test, and states symptoms include cough, congestion, fever, and body aches. Pt states her health history includes bipolar disorder, states height is 5'5", weight is 190#, calculated BMI>25. Pt informed an APP will be calling to verify information and if qualifies will set up an appointment.

## 2020-05-29 ENCOUNTER — Ambulatory Visit (HOSPITAL_COMMUNITY)
Admission: RE | Admit: 2020-05-29 | Discharge: 2020-05-29 | Disposition: A | Discharge status: Home / Self Care | Payer: BC Managed Care – PPO | Source: Ambulatory Visit | Attending: Pulmonary Disease | Admitting: Pulmonary Disease

## 2020-05-29 DIAGNOSIS — U071 COVID-19: Secondary | ICD-10-CM | POA: Insufficient documentation

## 2020-05-29 MED ORDER — METHYLPREDNISOLONE SODIUM SUCC 125 MG IJ SOLR: 125.0000 mg | Freq: Once | INTRAMUSCULAR | Status: DC | PRN

## 2020-05-29 MED ORDER — DIPHENHYDRAMINE HCL 50 MG/ML IJ SOLN: 50.0000 mg | Freq: Once | INTRAMUSCULAR | Status: DC | PRN

## 2020-05-29 MED ORDER — ALBUTEROL SULFATE HFA 108 (90 BASE) MCG/ACT IN AERS: 2.0000 | INHALATION_SPRAY | Freq: Once | RESPIRATORY_TRACT | Status: DC | PRN

## 2020-05-29 MED ORDER — FAMOTIDINE IN NACL 20-0.9 MG/50ML-% IV SOLN: 20.0000 mg | Freq: Once | INTRAVENOUS | Status: DC | PRN

## 2020-05-29 MED ORDER — SOTROVIMAB 500 MG/8ML IV SOLN
500.0000 mg | Freq: Once | INTRAVENOUS | Status: AC
  Administered 2020-05-29: 500 mg via INTRAVENOUS

## 2020-05-29 MED ORDER — SODIUM CHLORIDE 0.9 % IV SOLN: INTRAVENOUS | Status: DC | PRN

## 2020-05-29 MED ORDER — EPINEPHRINE 0.3 MG/0.3ML IJ SOAJ: 0.3000 mg | Freq: Once | INTRAMUSCULAR | Status: DC | PRN

## 2020-05-29 NOTE — Discharge Instructions (Signed)
10 Things You Can Do to Manage Your COVID-19 Symptoms at Home If you have possible or confirmed COVID-19: 1. Stay home from work and school. And stay away from other public places. If you must go out, avoid using any kind of public transportation, ridesharing, or taxis. 2. Monitor your symptoms carefully. If your symptoms get worse, call your healthcare provider immediately. 3. Get rest and stay hydrated. 4. If you have a medical appointment, call the healthcare provider ahead of time and tell them that you have or may have COVID-19. 5. For medical emergencies, call 911 and notify the dispatch personnel that you have or may have COVID-19. 6. Cover your cough and sneezes with a tissue or use the inside of your elbow. 7. Wash your hands often with soap and water for at least 20 seconds or clean your hands with an alcohol-based hand sanitizer that contains at least 60% alcohol. 8. As much as possible, stay in a specific room and away from other people in your home. Also, you should use a separate bathroom, if available. If you need to be around other people in or outside of the home, wear a mask. 9. Avoid sharing personal items with other people in your household, like dishes, towels, and bedding. 10. Clean all surfaces that are touched often, like counters, tabletops, and doorknobs. Use household cleaning sprays or wipes according to the label instructions. cdc.gov/coronavirus 12/27/2018 This information is not intended to replace advice given to you by your health care provider. Make sure you discuss any questions you have with your health care provider. Document Revised: 05/31/2019 Document Reviewed: 05/31/2019 Elsevier Patient Education  2020 Elsevier Inc. What types of side effects do monoclonal antibody drugs cause?  Common side effects  In general, the more common side effects caused by monoclonal antibody drugs include: . Allergic reactions, such as hives or itching . Flu-like signs and  symptoms, including chills, fatigue, fever, and muscle aches and pains . Nausea, vomiting . Diarrhea . Skin rashes . Low blood pressure   The CDC is recommending patients who receive monoclonal antibody treatments wait at least 90 days before being vaccinated.  Currently, there are no data on the safety and efficacy of mRNA COVID-19 vaccines in persons who received monoclonal antibodies or convalescent plasma as part of COVID-19 treatment. Based on the estimated half-life of such therapies as well as evidence suggesting that reinfection is uncommon in the 90 days after initial infection, vaccination should be deferred for at least 90 days, as a precautionary measure until additional information becomes available, to avoid interference of the antibody treatment with vaccine-induced immune responses. If you have any questions or concerns after the infusion please call the Advanced Practice Provider on call at [phone]. This number is ONLY intended for your use regarding questions or concerns about the infusion post-treatment side-effects.  Please do not provide this number to others for use. For return to work notes please contact your primary care provider.   If someone you know is interested in receiving treatment please have them call the COVID hotline at [phone].   

## 2020-05-29 NOTE — Progress Notes (Signed)
Patient reviewed Fact Sheet for Patients, Parents, and Caregivers for Emergency Use Authorization (EUA) of Sotrovimab for the Treatment of Coronavirus. Patient also reviewed and is agreeable to the estimated cost of treatment. Patient is agreeable to proceed.   

## 2020-05-29 NOTE — Progress Notes (Signed)
Diagnosis: COVID-19  Physician: Dr. Patrick Wright  Procedure: Covid Infusion Clinic Med: Sotrovimab infusion - Provided patient with sotrovimab fact sheet for patients, parents, and caregivers prior to infusion.   Complications: No immediate complications noted  Discharge: Discharged home    

## 2020-09-08 DIAGNOSIS — M25571 Pain in right ankle and joints of right foot: Secondary | ICD-10-CM | POA: Diagnosis not present

## 2020-09-11 ENCOUNTER — Ambulatory Visit: Payer: BC Managed Care – PPO | Admitting: Physician Assistant

## 2020-11-17 DIAGNOSIS — F900 Attention-deficit hyperactivity disorder, predominantly inattentive type: Secondary | ICD-10-CM | POA: Diagnosis not present

## 2020-11-17 DIAGNOSIS — F3176 Bipolar disorder, in full remission, most recent episode depressed: Secondary | ICD-10-CM | POA: Diagnosis not present

## 2020-11-17 DIAGNOSIS — F3174 Bipolar disorder, in full remission, most recent episode manic: Secondary | ICD-10-CM | POA: Diagnosis not present

## 2020-12-04 DIAGNOSIS — R059 Cough, unspecified: Secondary | ICD-10-CM | POA: Diagnosis not present

## 2020-12-04 DIAGNOSIS — J069 Acute upper respiratory infection, unspecified: Secondary | ICD-10-CM | POA: Diagnosis not present

## 2020-12-04 DIAGNOSIS — Z03818 Encounter for observation for suspected exposure to other biological agents ruled out: Secondary | ICD-10-CM | POA: Diagnosis not present

## 2021-02-02 DIAGNOSIS — Z1329 Encounter for screening for other suspected endocrine disorder: Secondary | ICD-10-CM | POA: Diagnosis not present

## 2021-02-02 DIAGNOSIS — E7800 Pure hypercholesterolemia, unspecified: Secondary | ICD-10-CM | POA: Diagnosis not present

## 2021-02-02 DIAGNOSIS — E559 Vitamin D deficiency, unspecified: Secondary | ICD-10-CM | POA: Diagnosis not present

## 2021-02-02 DIAGNOSIS — Z0000 Encounter for general adult medical examination without abnormal findings: Secondary | ICD-10-CM | POA: Diagnosis not present

## 2021-04-21 DIAGNOSIS — F3132 Bipolar disorder, current episode depressed, moderate: Secondary | ICD-10-CM | POA: Diagnosis not present

## 2021-04-21 DIAGNOSIS — F411 Generalized anxiety disorder: Secondary | ICD-10-CM | POA: Diagnosis not present

## 2021-05-18 DIAGNOSIS — Z1231 Encounter for screening mammogram for malignant neoplasm of breast: Secondary | ICD-10-CM | POA: Diagnosis not present

## 2021-05-18 DIAGNOSIS — D649 Anemia, unspecified: Secondary | ICD-10-CM | POA: Diagnosis not present

## 2021-05-18 DIAGNOSIS — Z8262 Family history of osteoporosis: Secondary | ICD-10-CM | POA: Diagnosis not present

## 2021-05-18 DIAGNOSIS — R29890 Loss of height: Secondary | ICD-10-CM | POA: Diagnosis not present

## 2021-05-18 DIAGNOSIS — Z13820 Encounter for screening for osteoporosis: Secondary | ICD-10-CM | POA: Diagnosis not present

## 2021-05-18 DIAGNOSIS — Z01419 Encounter for gynecological examination (general) (routine) without abnormal findings: Secondary | ICD-10-CM | POA: Diagnosis not present

## 2021-05-18 DIAGNOSIS — Z6831 Body mass index (BMI) 31.0-31.9, adult: Secondary | ICD-10-CM | POA: Diagnosis not present

## 2021-05-18 DIAGNOSIS — N958 Other specified menopausal and perimenopausal disorders: Secondary | ICD-10-CM | POA: Diagnosis not present

## 2021-06-02 DIAGNOSIS — F3132 Bipolar disorder, current episode depressed, moderate: Secondary | ICD-10-CM | POA: Diagnosis not present

## 2021-09-18 DIAGNOSIS — R051 Acute cough: Secondary | ICD-10-CM | POA: Diagnosis not present

## 2021-09-18 DIAGNOSIS — R0982 Postnasal drip: Secondary | ICD-10-CM | POA: Diagnosis not present

## 2021-09-21 DIAGNOSIS — R059 Cough, unspecified: Secondary | ICD-10-CM | POA: Diagnosis not present

## 2021-11-24 DIAGNOSIS — F900 Attention-deficit hyperactivity disorder, predominantly inattentive type: Secondary | ICD-10-CM | POA: Diagnosis not present

## 2021-11-24 DIAGNOSIS — F411 Generalized anxiety disorder: Secondary | ICD-10-CM | POA: Diagnosis not present

## 2021-11-24 DIAGNOSIS — F3176 Bipolar disorder, in full remission, most recent episode depressed: Secondary | ICD-10-CM | POA: Diagnosis not present

## 2021-11-24 DIAGNOSIS — F3174 Bipolar disorder, in full remission, most recent episode manic: Secondary | ICD-10-CM | POA: Diagnosis not present

## 2022-01-06 DIAGNOSIS — H6693 Otitis media, unspecified, bilateral: Secondary | ICD-10-CM | POA: Diagnosis not present

## 2022-01-06 DIAGNOSIS — J3489 Other specified disorders of nose and nasal sinuses: Secondary | ICD-10-CM | POA: Diagnosis not present

## 2022-01-06 DIAGNOSIS — J029 Acute pharyngitis, unspecified: Secondary | ICD-10-CM | POA: Diagnosis not present

## 2022-01-24 DIAGNOSIS — H524 Presbyopia: Secondary | ICD-10-CM | POA: Diagnosis not present

## 2022-01-24 DIAGNOSIS — H2513 Age-related nuclear cataract, bilateral: Secondary | ICD-10-CM | POA: Diagnosis not present

## 2022-02-10 DIAGNOSIS — E559 Vitamin D deficiency, unspecified: Secondary | ICD-10-CM | POA: Diagnosis not present

## 2022-02-10 DIAGNOSIS — R053 Chronic cough: Secondary | ICD-10-CM | POA: Diagnosis not present

## 2022-02-10 DIAGNOSIS — E7800 Pure hypercholesterolemia, unspecified: Secondary | ICD-10-CM | POA: Diagnosis not present

## 2022-02-10 DIAGNOSIS — Z131 Encounter for screening for diabetes mellitus: Secondary | ICD-10-CM | POA: Diagnosis not present

## 2022-02-10 DIAGNOSIS — Z0000 Encounter for general adult medical examination without abnormal findings: Secondary | ICD-10-CM | POA: Diagnosis not present

## 2022-02-10 DIAGNOSIS — Z23 Encounter for immunization: Secondary | ICD-10-CM | POA: Diagnosis not present

## 2022-03-27 ENCOUNTER — Encounter (HOSPITAL_BASED_OUTPATIENT_CLINIC_OR_DEPARTMENT_OTHER): Payer: Self-pay | Admitting: Emergency Medicine

## 2022-03-27 ENCOUNTER — Emergency Department (HOSPITAL_BASED_OUTPATIENT_CLINIC_OR_DEPARTMENT_OTHER): Payer: BC Managed Care – PPO

## 2022-03-27 ENCOUNTER — Other Ambulatory Visit: Payer: Self-pay

## 2022-03-27 ENCOUNTER — Emergency Department (HOSPITAL_BASED_OUTPATIENT_CLINIC_OR_DEPARTMENT_OTHER)
Admission: EM | Admit: 2022-03-27 | Discharge: 2022-03-27 | Disposition: A | Discharge status: Home / Self Care | Payer: BC Managed Care – PPO | Attending: Emergency Medicine | Admitting: Emergency Medicine

## 2022-03-27 DIAGNOSIS — R519 Headache, unspecified: Secondary | ICD-10-CM | POA: Diagnosis not present

## 2022-03-27 DIAGNOSIS — D649 Anemia, unspecified: Secondary | ICD-10-CM | POA: Insufficient documentation

## 2022-03-27 DIAGNOSIS — Z7982 Long term (current) use of aspirin: Secondary | ICD-10-CM | POA: Diagnosis not present

## 2022-03-27 DIAGNOSIS — Z79899 Other long term (current) drug therapy: Secondary | ICD-10-CM | POA: Diagnosis not present

## 2022-03-27 DIAGNOSIS — G44209 Tension-type headache, unspecified, not intractable: Secondary | ICD-10-CM | POA: Diagnosis not present

## 2022-03-27 DIAGNOSIS — L299 Pruritus, unspecified: Secondary | ICD-10-CM | POA: Insufficient documentation

## 2022-03-27 DIAGNOSIS — W57XXXA Bitten or stung by nonvenomous insect and other nonvenomous arthropods, initial encounter: Secondary | ICD-10-CM

## 2022-03-27 LAB — CBC WITH DIFFERENTIAL/PLATELET
Abs Immature Granulocytes: 0.01 10*3/uL (ref 0.00–0.07)
Basophils Absolute: 0.1 10*3/uL (ref 0.0–0.1)
Basophils Relative: 1 %
Eosinophils Absolute: 0.2 10*3/uL (ref 0.0–0.5)
Eosinophils Relative: 3 %
HCT: 34.8 % — ABNORMAL LOW (ref 36.0–46.0)
Hemoglobin: 11.6 g/dL — ABNORMAL LOW (ref 12.0–15.0)
Immature Granulocytes: 0 %
Lymphocytes Relative: 27 %
Lymphs Abs: 1.5 10*3/uL (ref 0.7–4.0)
MCH: 30.3 pg (ref 26.0–34.0)
MCHC: 33.3 g/dL (ref 30.0–36.0)
MCV: 90.9 fL (ref 80.0–100.0)
Monocytes Absolute: 0.4 10*3/uL (ref 0.1–1.0)
Monocytes Relative: 6 %
Neutro Abs: 3.6 10*3/uL (ref 1.7–7.7)
Neutrophils Relative %: 63 %
Platelets: 210 10*3/uL (ref 150–400)
RBC: 3.83 MIL/uL — ABNORMAL LOW (ref 3.87–5.11)
RDW: 13.5 % (ref 11.5–15.5)
WBC: 5.7 10*3/uL (ref 4.0–10.5)
nRBC: 0 % (ref 0.0–0.2)

## 2022-03-27 LAB — BASIC METABOLIC PANEL
Anion gap: 10 (ref 5–15)
BUN: 16 mg/dL (ref 8–23)
CO2: 28 mmol/L (ref 22–32)
Calcium: 8.5 mg/dL — ABNORMAL LOW (ref 8.9–10.3)
Chloride: 104 mmol/L (ref 98–111)
Creatinine, Ser: 0.77 mg/dL (ref 0.44–1.00)
GFR, Estimated: >60 mL/min (ref >60)
Glucose, Bld: 91 mg/dL (ref 70–99)
Potassium: 3.8 mmol/L (ref 3.5–5.1)
Sodium: 142 mmol/L (ref 135–145)

## 2022-03-27 LAB — HEPATIC FUNCTION PANEL
ALT: 10 U/L (ref 0–44)
AST: 12 U/L — ABNORMAL LOW (ref 15–41)
Albumin: 4 g/dL (ref 3.5–5.0)
Alkaline Phosphatase: 28 U/L — ABNORMAL LOW (ref 38–126)
Bilirubin, Direct: 0.1 mg/dL (ref 0.0–0.2)
Indirect Bilirubin: 0.2 mg/dL — ABNORMAL LOW (ref 0.3–0.9)
Total Bilirubin: 0.3 mg/dL (ref 0.3–1.2)
Total Protein: 6.4 g/dL — ABNORMAL LOW (ref 6.5–8.1)

## 2022-03-27 LAB — TSH: TSH: 0.528 u[IU]/mL (ref 0.350–4.500)

## 2022-03-27 MED ORDER — HYDROXYZINE HCL 25 MG PO TABS: 25.0000 mg | ORAL_TABLET | Freq: Four times a day (QID) | ORAL | 0 refills | Status: AC

## 2022-03-27 MED ORDER — DIPHENHYDRAMINE HCL 25 MG PO CAPS
25.0000 mg | ORAL_CAPSULE | Freq: Once | ORAL | Status: AC
  Administered 2022-03-27: 25 mg via ORAL
  Filled 2022-03-27: qty 1

## 2022-03-27 NOTE — ED Triage Notes (Signed)
Patient reports about 2 weeks of itching to torso/back /upper buttocks/pelvis. She states she has had 3 possible causes: she changed her antacid med around that time to an OTC< she had a bug bite to her right thigh and her husband is having prostate issues and wondered if she was having internal stress.

## 2022-03-27 NOTE — ED Notes (Signed)
Pt states she has been taking benadryl every night to help  sleep.

## 2022-03-27 NOTE — Discharge Instructions (Addendum)
Recommend you stop any offending agents such as new medicines or supplements. Your labs showed a mild anemia, but were otherwise unremarkable. Your chest XR was normal. Recommend follow-up with your PCP, contine to utilize benadryl or atarax for itching.

## 2022-03-27 NOTE — ED Provider Notes (Signed)
[facility] DEPT Provider Note   CSN: [id number] Arrival date & time: 03/27/22  6761     History  Chief Complaint  Patient presents with   Pruritis    Kelly Powers is a 61 y.o. female.  HPI   61 year old female presenting to the emergency department with a chief complaint of pruritus.  The patient states that she has been having pruritus for the last 2 weeks.  She endorses generalized itching along her torso, back, upper buttocks and pelvis.  She denies any hives/urticaria.  She recently changed her antacid medication to an over-the-counter medication.  She additionally had a bug bite to her right thigh although this has not been infected.  She endorses increased stress recently.  Additionally, she states that she has been taking THC supplements for the last 3 weeks which could also be a cause.  She denies any fever, chills, weight loss, night sweats, cough, shortness of breath, any other skin lesions or rashes.  Denies any changes to soaps or detergents.  Additionally, she complains of a headache that is located in a bandlike pattern across her forehead with no radiation, not sudden onset or maximal in onset, no neck stiffness or fever, no vision changes, no neurologic deficits.  Symptoms have been ongoing for the past 2 days.  Home Medications Prior to Admission medications   Medication Sig Start Date End Date Taking? Authorizing Provider  hydrOXYzine (ATARAX) 25 MG tablet Take 1 tablet (25 mg total) by mouth every 6 (six) hours. 03/27/22  Yes Regan Lemming, MD  aspirin EC 81 MG tablet Take 81 mg by mouth daily.    [provider]  cyclobenzaprine (FLEXERIL) 10 MG tablet Take 10 mg by mouth at bedtime.    [provider]  dextroamphetamine (DEXEDRINE SPANSULE) 10 MG 24 hr capsule Take 10 mg by mouth daily.    [provider]  estradiol (ESTRACE) 0.5 MG tablet Take 0.5 mg by mouth daily.    [provider]  ibuprofen  (ADVIL,MOTRIN) 200 MG tablet Take 200-400 mg by mouth every 6 (six) hours as needed for moderate pain.    [provider]  lamoTRIgine (LAMICTAL) 200 MG tablet Take 400 mg by mouth daily.    [provider]      Allergies    Penicillins, Afrin [oxymetazoline], and Doxycycline    Review of Systems   Review of Systems  All other systems reviewed and are negative.   Physical Exam Updated Vital Signs BP 122/72 (BP Location: Left Arm)   Pulse 60   Temp 97.8 F (36.6 C) (Oral)   Resp 14   Ht 5\' 5"  (1.651 m)   Wt 89.8 kg   SpO2 100%   BMI 32.95 kg/m  Physical Exam Vitals and nursing note reviewed.  Constitutional:      General: She is not in acute distress. HENT:     Head: Normocephalic and atraumatic.  Eyes:     Conjunctiva/sclera: Conjunctivae normal.     Pupils: Pupils are equal, round, and reactive to light.  Cardiovascular:     Rate and Rhythm: Normal rate and regular rhythm.  Pulmonary:     Effort: Pulmonary effort is normal. No respiratory distress.  Abdominal:     General: There is no distension.     Tenderness: There is no abdominal tenderness. There is no guarding.  Musculoskeletal:        General: No deformity or signs of injury.     Cervical back: Neck  supple.     Comments: Old healing insect bite to the right upper thigh, no surrounding urticaria, mild surrounding erythema with no fluctuance, no tenderness to palpation, no warmth  Skin:    Findings: No lesion or rash.  Neurological:     General: No focal deficit present.     Mental Status: She is alert. Mental status is at baseline.     Cranial Nerves: No cranial nerve deficit.     Sensory: No sensory deficit.     Motor: No weakness.     Coordination: Coordination normal.     Gait: Gait normal.     ED Results / Procedures / Treatments   Labs (all labs ordered are listed, but only abnormal results are displayed) Labs Reviewed  CBC WITH DIFFERENTIAL/PLATELET - Abnormal; Notable for  the following components:      Result Value   RBC 3.83 (*)    Hemoglobin 11.6 (*)    HCT 34.8 (*)    All other components within normal limits  BASIC METABOLIC PANEL - Abnormal; Notable for the following components:   Calcium 8.5 (*)    All other components within normal limits  HEPATIC FUNCTION PANEL - Abnormal; Notable for the following components:   Total Protein 6.4 (*)    AST 12 (*)    Alkaline Phosphatase 28 (*)    Indirect Bilirubin 0.2 (*)    All other components within normal limits  TSH    EKG None  Radiology No results found.  Procedures Procedures    Medications Ordered in ED Medications  diphenhydrAMINE (BENADRYL) capsule 25 mg (25 mg Oral Given 03/27/22 0946)    ED Course/ Medical Decision Making/ A&P                           Medical Decision Making Amount and/or Complexity of Data Reviewed Labs: ordered. Radiology: ordered.  Risk Prescription drug management.     61 year old female presenting to the emergency department with a chief complaint of pruritus.  The patient states that she has been having pruritus for the last 2 weeks.  She endorses generalized itching along her torso, back, upper buttocks and pelvis.  She denies any hives/urticaria.  She recently changed her antacid medication to an over-the-counter medication.  She additionally had a bug bite to her right thigh although this has not been infected.  She endorses increased stress recently.  Additionally, she states that she has been taking THC supplements for the last 3 weeks which could also be a cause.  She denies any fever, chills, weight loss, night sweats, cough, shortness of breath, any other skin lesions or rashes.  Denies any changes to soaps or detergents.  Additionally, she complains of a headache that is located in a bandlike pattern across her forehead with no radiation, not sudden onset or maximal in onset, no neck stiffness or fever, no vision changes, no neurologic deficits.   Symptoms have been ongoing for the past 2 days.  On arrival, the patient was vitally stable.  Physical exam without evidence of urticaria.  She does have a small healing insect bite to the upper thigh.  She has had no fevers or chills.  Presenting with left with 2 weeks of pruritus.  She endorses taking THC supplements and had a single medication change taking over-the-counter GERD medication.  No other signs of allergic reaction or anaphylaxis.  Screening laboratory evaluation significant for CBC without a leukocytosis, mild anemia noted  to 11.6, BMP unremarkable, TSH normal, hepatic function panel generally unremarkable.  Chest x-ray was performed which revealed no hilar lymphadenopathy or evidence of underlying malignancy.  The patient was administered Benadryl.  Patient also presenting with tension type headache, is neurologically intact.  Offered Tylenol but the patient refused stating that she can take Tylenol at home.  No fevers or neck stiffness.  Low concern for intracranial hemorrhage, headache symptoms gradual in onset, no nausea or vomiting, no vision changes, no neurologic deficits, no neck stiffness or meningeal findings.  Low concern for acute emergency requiring inpatient hospitalization at this time.  Advised the patient continue to follow-up with her PCP for outpatient management.    Final Clinical Impression(s) / ED Diagnoses Final diagnoses:  Pruritus  Acute non intractable tension-type headache  Bug bite, initial encounter  Anemia, unspecified type    Rx / DC Orders ED Discharge Orders          Ordered    hydrOXYzine (ATARAX) 25 MG tablet  Every 6 hours        03/27/22 1140              Ernie Avena, MD 03/29/22 1703

## 2022-04-12 DIAGNOSIS — Z23 Encounter for immunization: Secondary | ICD-10-CM | POA: Diagnosis not present

## 2022-04-12 DIAGNOSIS — R053 Chronic cough: Secondary | ICD-10-CM | POA: Diagnosis not present

## 2022-04-12 DIAGNOSIS — K219 Gastro-esophageal reflux disease without esophagitis: Secondary | ICD-10-CM | POA: Diagnosis not present

## 2022-05-17 DIAGNOSIS — H2512 Age-related nuclear cataract, left eye: Secondary | ICD-10-CM | POA: Diagnosis not present

## 2022-05-17 DIAGNOSIS — H52202 Unspecified astigmatism, left eye: Secondary | ICD-10-CM | POA: Diagnosis not present

## 2022-05-18 DIAGNOSIS — H2511 Age-related nuclear cataract, right eye: Secondary | ICD-10-CM | POA: Diagnosis not present

## 2022-05-24 DIAGNOSIS — Z1231 Encounter for screening mammogram for malignant neoplasm of breast: Secondary | ICD-10-CM | POA: Diagnosis not present

## 2022-05-24 DIAGNOSIS — Z01419 Encounter for gynecological examination (general) (routine) without abnormal findings: Secondary | ICD-10-CM | POA: Diagnosis not present

## 2022-05-24 DIAGNOSIS — Z6834 Body mass index (BMI) 34.0-34.9, adult: Secondary | ICD-10-CM | POA: Diagnosis not present

## 2022-05-28 DIAGNOSIS — Z23 Encounter for immunization: Secondary | ICD-10-CM | POA: Diagnosis not present

## 2022-06-14 DIAGNOSIS — H2511 Age-related nuclear cataract, right eye: Secondary | ICD-10-CM | POA: Diagnosis not present

## 2022-06-14 DIAGNOSIS — H52201 Unspecified astigmatism, right eye: Secondary | ICD-10-CM | POA: Diagnosis not present

## 2022-07-19 DIAGNOSIS — E785 Hyperlipidemia, unspecified: Secondary | ICD-10-CM | POA: Diagnosis not present

## 2022-07-19 DIAGNOSIS — E559 Vitamin D deficiency, unspecified: Secondary | ICD-10-CM | POA: Diagnosis not present

## 2022-07-19 DIAGNOSIS — F909 Attention-deficit hyperactivity disorder, unspecified type: Secondary | ICD-10-CM | POA: Diagnosis not present

## 2022-07-19 DIAGNOSIS — Z1331 Encounter for screening for depression: Secondary | ICD-10-CM | POA: Diagnosis not present

## 2022-07-19 DIAGNOSIS — G4733 Obstructive sleep apnea (adult) (pediatric): Secondary | ICD-10-CM | POA: Diagnosis not present

## 2022-08-02 DIAGNOSIS — E785 Hyperlipidemia, unspecified: Secondary | ICD-10-CM | POA: Diagnosis not present

## 2022-08-02 DIAGNOSIS — R7303 Prediabetes: Secondary | ICD-10-CM | POA: Diagnosis not present

## 2022-08-02 DIAGNOSIS — F3181 Bipolar II disorder: Secondary | ICD-10-CM | POA: Diagnosis not present

## 2022-08-02 DIAGNOSIS — E669 Obesity, unspecified: Secondary | ICD-10-CM | POA: Diagnosis not present

## 2022-08-16 DIAGNOSIS — E785 Hyperlipidemia, unspecified: Secondary | ICD-10-CM | POA: Diagnosis not present

## 2022-08-16 DIAGNOSIS — F3181 Bipolar II disorder: Secondary | ICD-10-CM | POA: Diagnosis not present

## 2022-08-16 DIAGNOSIS — E669 Obesity, unspecified: Secondary | ICD-10-CM | POA: Diagnosis not present

## 2022-08-16 DIAGNOSIS — R7303 Prediabetes: Secondary | ICD-10-CM | POA: Diagnosis not present

## 2022-08-20 DIAGNOSIS — K219 Gastro-esophageal reflux disease without esophagitis: Secondary | ICD-10-CM | POA: Diagnosis not present

## 2022-08-20 DIAGNOSIS — R1310 Dysphagia, unspecified: Secondary | ICD-10-CM | POA: Diagnosis not present

## 2022-08-22 DIAGNOSIS — N3001 Acute cystitis with hematuria: Secondary | ICD-10-CM | POA: Diagnosis not present

## 2022-08-22 DIAGNOSIS — R399 Unspecified symptoms and signs involving the genitourinary system: Secondary | ICD-10-CM | POA: Diagnosis not present

## 2022-08-22 DIAGNOSIS — R300 Dysuria: Secondary | ICD-10-CM | POA: Diagnosis not present

## 2022-08-30 DIAGNOSIS — F3181 Bipolar II disorder: Secondary | ICD-10-CM | POA: Diagnosis not present

## 2022-08-30 DIAGNOSIS — R7303 Prediabetes: Secondary | ICD-10-CM | POA: Diagnosis not present

## 2022-08-30 DIAGNOSIS — E669 Obesity, unspecified: Secondary | ICD-10-CM | POA: Diagnosis not present

## 2022-08-30 DIAGNOSIS — E785 Hyperlipidemia, unspecified: Secondary | ICD-10-CM | POA: Diagnosis not present

## 2022-09-13 DIAGNOSIS — R7303 Prediabetes: Secondary | ICD-10-CM | POA: Diagnosis not present

## 2022-09-13 DIAGNOSIS — E669 Obesity, unspecified: Secondary | ICD-10-CM | POA: Diagnosis not present

## 2022-09-13 DIAGNOSIS — E785 Hyperlipidemia, unspecified: Secondary | ICD-10-CM | POA: Diagnosis not present

## 2022-09-13 DIAGNOSIS — F3181 Bipolar II disorder: Secondary | ICD-10-CM | POA: Diagnosis not present

## 2022-09-20 DIAGNOSIS — G4719 Other hypersomnia: Secondary | ICD-10-CM | POA: Diagnosis not present

## 2022-09-29 DIAGNOSIS — R7303 Prediabetes: Secondary | ICD-10-CM | POA: Diagnosis not present

## 2022-09-29 DIAGNOSIS — F3181 Bipolar II disorder: Secondary | ICD-10-CM | POA: Diagnosis not present

## 2022-09-29 DIAGNOSIS — E785 Hyperlipidemia, unspecified: Secondary | ICD-10-CM | POA: Diagnosis not present

## 2022-09-29 DIAGNOSIS — R5383 Other fatigue: Secondary | ICD-10-CM | POA: Diagnosis not present

## 2022-11-08 DIAGNOSIS — E669 Obesity, unspecified: Secondary | ICD-10-CM | POA: Diagnosis not present

## 2022-11-08 DIAGNOSIS — G4733 Obstructive sleep apnea (adult) (pediatric): Secondary | ICD-10-CM | POA: Diagnosis not present

## 2022-11-15 DIAGNOSIS — E785 Hyperlipidemia, unspecified: Secondary | ICD-10-CM | POA: Diagnosis not present

## 2022-11-15 DIAGNOSIS — G4733 Obstructive sleep apnea (adult) (pediatric): Secondary | ICD-10-CM | POA: Diagnosis not present

## 2022-11-15 DIAGNOSIS — R7303 Prediabetes: Secondary | ICD-10-CM | POA: Diagnosis not present

## 2022-11-15 DIAGNOSIS — F3181 Bipolar II disorder: Secondary | ICD-10-CM | POA: Diagnosis not present

## 2022-11-16 DIAGNOSIS — F3174 Bipolar disorder, in full remission, most recent episode manic: Secondary | ICD-10-CM | POA: Diagnosis not present

## 2022-11-16 DIAGNOSIS — F411 Generalized anxiety disorder: Secondary | ICD-10-CM | POA: Diagnosis not present

## 2022-11-16 DIAGNOSIS — F3176 Bipolar disorder, in full remission, most recent episode depressed: Secondary | ICD-10-CM | POA: Diagnosis not present

## 2022-11-16 DIAGNOSIS — F900 Attention-deficit hyperactivity disorder, predominantly inattentive type: Secondary | ICD-10-CM | POA: Diagnosis not present

## 2022-12-06 DIAGNOSIS — B9681 Helicobacter pylori [H. pylori] as the cause of diseases classified elsewhere: Secondary | ICD-10-CM | POA: Diagnosis not present

## 2022-12-06 DIAGNOSIS — K222 Esophageal obstruction: Secondary | ICD-10-CM | POA: Diagnosis not present

## 2022-12-06 DIAGNOSIS — K219 Gastro-esophageal reflux disease without esophagitis: Secondary | ICD-10-CM | POA: Diagnosis not present

## 2022-12-06 DIAGNOSIS — Z1211 Encounter for screening for malignant neoplasm of colon: Secondary | ICD-10-CM | POA: Diagnosis not present

## 2022-12-06 DIAGNOSIS — R1310 Dysphagia, unspecified: Secondary | ICD-10-CM | POA: Diagnosis not present

## 2022-12-06 DIAGNOSIS — K2930 Chronic superficial gastritis without bleeding: Secondary | ICD-10-CM | POA: Diagnosis not present

## 2022-12-06 DIAGNOSIS — K5730 Diverticulosis of large intestine without perforation or abscess without bleeding: Secondary | ICD-10-CM | POA: Diagnosis not present

## 2023-01-19 DIAGNOSIS — Z9189 Other specified personal risk factors, not elsewhere classified: Secondary | ICD-10-CM | POA: Diagnosis not present

## 2023-01-19 DIAGNOSIS — G4733 Obstructive sleep apnea (adult) (pediatric): Secondary | ICD-10-CM | POA: Diagnosis not present

## 2023-01-19 DIAGNOSIS — F3181 Bipolar II disorder: Secondary | ICD-10-CM | POA: Diagnosis not present

## 2023-01-19 DIAGNOSIS — E785 Hyperlipidemia, unspecified: Secondary | ICD-10-CM | POA: Diagnosis not present

## 2023-01-19 DIAGNOSIS — R7303 Prediabetes: Secondary | ICD-10-CM | POA: Diagnosis not present

## 2023-02-14 DIAGNOSIS — R7303 Prediabetes: Secondary | ICD-10-CM | POA: Diagnosis not present

## 2023-02-14 DIAGNOSIS — G4733 Obstructive sleep apnea (adult) (pediatric): Secondary | ICD-10-CM | POA: Diagnosis not present

## 2023-02-14 DIAGNOSIS — Z9189 Other specified personal risk factors, not elsewhere classified: Secondary | ICD-10-CM | POA: Diagnosis not present

## 2023-02-14 DIAGNOSIS — E785 Hyperlipidemia, unspecified: Secondary | ICD-10-CM | POA: Diagnosis not present

## 2023-02-14 DIAGNOSIS — F3181 Bipolar II disorder: Secondary | ICD-10-CM | POA: Diagnosis not present

## 2023-02-21 DIAGNOSIS — Z0000 Encounter for general adult medical examination without abnormal findings: Secondary | ICD-10-CM | POA: Diagnosis not present

## 2023-02-21 DIAGNOSIS — R053 Chronic cough: Secondary | ICD-10-CM | POA: Diagnosis not present

## 2023-02-21 DIAGNOSIS — E559 Vitamin D deficiency, unspecified: Secondary | ICD-10-CM | POA: Diagnosis not present

## 2023-02-21 DIAGNOSIS — E7800 Pure hypercholesterolemia, unspecified: Secondary | ICD-10-CM | POA: Diagnosis not present

## 2023-03-09 DIAGNOSIS — E785 Hyperlipidemia, unspecified: Secondary | ICD-10-CM | POA: Diagnosis not present

## 2023-03-09 DIAGNOSIS — G4733 Obstructive sleep apnea (adult) (pediatric): Secondary | ICD-10-CM | POA: Diagnosis not present

## 2023-03-09 DIAGNOSIS — F3181 Bipolar II disorder: Secondary | ICD-10-CM | POA: Diagnosis not present

## 2023-03-09 DIAGNOSIS — R7303 Prediabetes: Secondary | ICD-10-CM | POA: Diagnosis not present

## 2023-03-28 DIAGNOSIS — F3181 Bipolar II disorder: Secondary | ICD-10-CM | POA: Diagnosis not present

## 2023-03-28 DIAGNOSIS — R7303 Prediabetes: Secondary | ICD-10-CM | POA: Diagnosis not present

## 2023-03-28 DIAGNOSIS — G4733 Obstructive sleep apnea (adult) (pediatric): Secondary | ICD-10-CM | POA: Diagnosis not present

## 2023-03-28 DIAGNOSIS — E785 Hyperlipidemia, unspecified: Secondary | ICD-10-CM | POA: Diagnosis not present

## 2023-05-02 DIAGNOSIS — F3181 Bipolar II disorder: Secondary | ICD-10-CM | POA: Diagnosis not present

## 2023-05-02 DIAGNOSIS — Z79899 Other long term (current) drug therapy: Secondary | ICD-10-CM | POA: Diagnosis not present

## 2023-05-02 DIAGNOSIS — Z6830 Body mass index (BMI) 30.0-30.9, adult: Secondary | ICD-10-CM | POA: Diagnosis not present

## 2023-05-02 DIAGNOSIS — E785 Hyperlipidemia, unspecified: Secondary | ICD-10-CM | POA: Diagnosis not present

## 2023-05-02 DIAGNOSIS — E669 Obesity, unspecified: Secondary | ICD-10-CM | POA: Diagnosis not present

## 2023-05-02 DIAGNOSIS — G4733 Obstructive sleep apnea (adult) (pediatric): Secondary | ICD-10-CM | POA: Diagnosis not present

## 2023-05-02 DIAGNOSIS — R7303 Prediabetes: Secondary | ICD-10-CM | POA: Diagnosis not present

## 2023-05-16 DIAGNOSIS — F3174 Bipolar disorder, in full remission, most recent episode manic: Secondary | ICD-10-CM | POA: Diagnosis not present

## 2023-05-16 DIAGNOSIS — G251 Drug-induced tremor: Secondary | ICD-10-CM | POA: Diagnosis not present

## 2023-05-16 DIAGNOSIS — F900 Attention-deficit hyperactivity disorder, predominantly inattentive type: Secondary | ICD-10-CM | POA: Diagnosis not present

## 2023-05-16 DIAGNOSIS — F3176 Bipolar disorder, in full remission, most recent episode depressed: Secondary | ICD-10-CM | POA: Diagnosis not present

## 2023-06-03 DIAGNOSIS — Z01419 Encounter for gynecological examination (general) (routine) without abnormal findings: Secondary | ICD-10-CM | POA: Diagnosis not present

## 2023-06-03 DIAGNOSIS — Z1231 Encounter for screening mammogram for malignant neoplasm of breast: Secondary | ICD-10-CM | POA: Diagnosis not present

## 2023-06-03 DIAGNOSIS — Z1151 Encounter for screening for human papillomavirus (HPV): Secondary | ICD-10-CM | POA: Diagnosis not present

## 2023-06-03 DIAGNOSIS — Z1272 Encounter for screening for malignant neoplasm of vagina: Secondary | ICD-10-CM | POA: Diagnosis not present

## 2023-06-03 DIAGNOSIS — Z6831 Body mass index (BMI) 31.0-31.9, adult: Secondary | ICD-10-CM | POA: Diagnosis not present

## 2023-06-15 DIAGNOSIS — F3181 Bipolar II disorder: Secondary | ICD-10-CM | POA: Diagnosis not present

## 2023-06-15 DIAGNOSIS — Z9189 Other specified personal risk factors, not elsewhere classified: Secondary | ICD-10-CM | POA: Diagnosis not present

## 2023-06-15 DIAGNOSIS — E785 Hyperlipidemia, unspecified: Secondary | ICD-10-CM | POA: Diagnosis not present

## 2023-06-15 DIAGNOSIS — G4733 Obstructive sleep apnea (adult) (pediatric): Secondary | ICD-10-CM | POA: Diagnosis not present

## 2023-06-15 DIAGNOSIS — Z6830 Body mass index (BMI) 30.0-30.9, adult: Secondary | ICD-10-CM | POA: Diagnosis not present

## 2023-06-15 DIAGNOSIS — R7303 Prediabetes: Secondary | ICD-10-CM | POA: Diagnosis not present

## 2023-06-15 DIAGNOSIS — E669 Obesity, unspecified: Secondary | ICD-10-CM | POA: Diagnosis not present

## 2023-07-13 DIAGNOSIS — K219 Gastro-esophageal reflux disease without esophagitis: Secondary | ICD-10-CM | POA: Diagnosis not present

## 2023-07-13 DIAGNOSIS — K2950 Unspecified chronic gastritis without bleeding: Secondary | ICD-10-CM | POA: Diagnosis not present

## 2023-07-13 DIAGNOSIS — B9681 Helicobacter pylori [H. pylori] as the cause of diseases classified elsewhere: Secondary | ICD-10-CM | POA: Diagnosis not present

## 2023-07-18 DIAGNOSIS — F900 Attention-deficit hyperactivity disorder, predominantly inattentive type: Secondary | ICD-10-CM | POA: Diagnosis not present

## 2023-07-18 DIAGNOSIS — F3174 Bipolar disorder, in full remission, most recent episode manic: Secondary | ICD-10-CM | POA: Diagnosis not present

## 2023-07-18 DIAGNOSIS — F3131 Bipolar disorder, current episode depressed, mild: Secondary | ICD-10-CM | POA: Diagnosis not present

## 2023-12-05 DIAGNOSIS — L603 Nail dystrophy: Secondary | ICD-10-CM | POA: Diagnosis not present

## 2024-01-06 DIAGNOSIS — F900 Attention-deficit hyperactivity disorder, predominantly inattentive type: Secondary | ICD-10-CM | POA: Diagnosis not present

## 2024-01-06 DIAGNOSIS — F3176 Bipolar disorder, in full remission, most recent episode depressed: Secondary | ICD-10-CM | POA: Diagnosis not present

## 2024-01-06 DIAGNOSIS — F3174 Bipolar disorder, in full remission, most recent episode manic: Secondary | ICD-10-CM | POA: Diagnosis not present

## 2024-03-09 DIAGNOSIS — E559 Vitamin D deficiency, unspecified: Secondary | ICD-10-CM | POA: Diagnosis not present

## 2024-03-09 DIAGNOSIS — R252 Cramp and spasm: Secondary | ICD-10-CM | POA: Diagnosis not present

## 2024-03-09 DIAGNOSIS — Z23 Encounter for immunization: Secondary | ICD-10-CM | POA: Diagnosis not present

## 2024-03-09 DIAGNOSIS — E7800 Pure hypercholesterolemia, unspecified: Secondary | ICD-10-CM | POA: Diagnosis not present

## 2024-03-09 DIAGNOSIS — Z0000 Encounter for general adult medical examination without abnormal findings: Secondary | ICD-10-CM | POA: Diagnosis not present

## 2024-03-09 DIAGNOSIS — R053 Chronic cough: Secondary | ICD-10-CM | POA: Diagnosis not present

## 2024-04-14 DIAGNOSIS — R059 Cough, unspecified: Secondary | ICD-10-CM | POA: Diagnosis not present

## 2024-04-20 DIAGNOSIS — R490 Dysphonia: Secondary | ICD-10-CM | POA: Diagnosis not present

## 2024-04-20 DIAGNOSIS — R053 Chronic cough: Secondary | ICD-10-CM | POA: Diagnosis not present

## 2024-06-01 DIAGNOSIS — Z23 Encounter for immunization: Secondary | ICD-10-CM | POA: Diagnosis not present

## 2024-06-14 NOTE — Progress Notes (Deleted)
° °  New Patient Pulmonology Office Visit   Subjective:  Patient ID: Kelly Powers, female    DOB: 02-14-1961  MRN: [id number]  Referred by: Regino Slater, MD  CC: No chief complaint on file.   HPI Kelly Powers is a 63 y.o. female with *** for chronic cough.  {PULM QUESTIONNAIRES (Optional):33196}  ROS  Allergies: Penicillins, Afrin [oxymetazoline], and Doxycycline Current Medications[1] Past Medical History:  Diagnosis Date   Bipolar 1 disorder (HCC)    Past Surgical History:  Procedure Laterality Date   ABDOMINAL HYSTERECTOMY     No family history on file. Social History   Socioeconomic History   Marital status: Married    Spouse name: Not on file   Number of children: Not on file   Years of education: Not on file   Highest education level: Not on file  Occupational History   Not on file  Tobacco Use   Smoking status: Former    Current packs/day: 0.00    Types: Cigarettes    Quit date: 10/30/1998    Years since quitting: 25.6   Smokeless tobacco: Not on file  Substance and Sexual Activity   Alcohol use: Yes    Comment: occassionally/ x 1/week.   Drug use: No   Sexual activity: Yes    Birth control/protection: None  Other Topics Concern   Not on file  Social History Narrative   Not on file   Social Drivers of Health   Tobacco Use: Medium Risk (08/22/2022)   Received from [facility], [facility] visits prior to 08/28/2022.   Patient History    Smoking Tobacco Use: Former    Smokeless Tobacco Use: Never    Passive Exposure: Not on Actuary Strain: Not on file  Food Insecurity: Not on file  Transportation Needs: Not on file  Physical Activity: Not on file  Stress: Not on file  Social Connections: Not on file  Intimate Partner Violence: Not on file  Depression (EYV7-0): Not on file  Alcohol Screen: Not on file  Housing: Not on file  Utilities: Not on file  Health Literacy: Not on file        Objective:  There were no vitals taken for this visit. {Pulm Vitals (Optional):32837}  Physical Exam  Diagnostic Review:  {Labs (Optional):32838}     Assessment & Plan:   Assessment & Plan   No orders of the defined types were placed in this encounter.     No follow-ups on file.   Evita Merida, MD    [1]  Current Outpatient Medications:    aspirin EC 81 MG tablet, Take 81 mg by mouth daily., Disp: , Rfl:    cyclobenzaprine (FLEXERIL) 10 MG tablet, Take 10 mg by mouth at bedtime., Disp: , Rfl:    dextroamphetamine (DEXEDRINE SPANSULE) 10 MG 24 hr capsule, Take 10 mg by mouth daily., Disp: , Rfl:    estradiol (ESTRACE) 0.5 MG tablet, Take 0.5 mg by mouth daily., Disp: , Rfl:    hydrOXYzine  (ATARAX ) 25 MG tablet, Take 1 tablet (25 mg total) by mouth every 6 (six) hours., Disp: 12 tablet, Rfl: 0   ibuprofen  (ADVIL ,MOTRIN ) 200 MG tablet, Take 200-400 mg by mouth every 6 (six) hours as needed for moderate pain., Disp: , Rfl:    lamoTRIgine (LAMICTAL) 200 MG tablet, Take 400 mg by mouth daily., Disp: , Rfl:

## 2024-06-15 ENCOUNTER — Ambulatory Visit (HOSPITAL_BASED_OUTPATIENT_CLINIC_OR_DEPARTMENT_OTHER): Admitting: Pulmonary Disease

## 2024-07-23 ENCOUNTER — Ambulatory Visit (HOSPITAL_BASED_OUTPATIENT_CLINIC_OR_DEPARTMENT_OTHER): Admitting: Pulmonary Disease

## 2024-08-15 ENCOUNTER — Ambulatory Visit
